# Patient Record
Sex: Female | Born: 1975 | Race: White | Hispanic: No | Marital: Married | Smoking: Never smoker
Health system: Southern US, Community
[De-identification: ages and names within clinical notes are randomized; demographics above are authoritative.]

## PROBLEM LIST (undated history)

## (undated) DIAGNOSIS — E079 Disorder of thyroid, unspecified: Secondary | ICD-10-CM

## (undated) DIAGNOSIS — G4730 Sleep apnea, unspecified: Secondary | ICD-10-CM

## (undated) HISTORY — PX: WISDOM TOOTH EXTRACTION: SHX21

## (undated) HISTORY — DX: Sleep apnea, unspecified: G47.30

## (undated) HISTORY — PX: CHOLECYSTECTOMY: SHX55

## (undated) HISTORY — PX: TUBAL LIGATION: SHX77

## (undated) HISTORY — PX: BACK SURGERY: SHX140

---

## 2000-05-16 ENCOUNTER — Other Ambulatory Visit: Admission: RE | Admit: 2000-05-16 | Discharge: 2000-05-16 | Payer: Self-pay | Admitting: Obstetrics and Gynecology

## 2000-10-27 ENCOUNTER — Inpatient Hospital Stay (HOSPITAL_COMMUNITY): Admission: AD | Admit: 2000-10-27 | Discharge: 2000-10-29 | Payer: Self-pay | Admitting: Obstetrics and Gynecology

## 2001-06-14 ENCOUNTER — Other Ambulatory Visit: Admission: RE | Admit: 2001-06-14 | Discharge: 2001-06-14 | Payer: Self-pay | Admitting: Obstetrics and Gynecology

## 2004-04-05 ENCOUNTER — Other Ambulatory Visit: Admission: RE | Admit: 2004-04-05 | Discharge: 2004-04-05 | Payer: Self-pay | Admitting: Obstetrics and Gynecology

## 2004-05-17 ENCOUNTER — Ambulatory Visit (HOSPITAL_COMMUNITY): Admission: RE | Admit: 2004-05-17 | Discharge: 2004-05-17 | Payer: Self-pay | Admitting: Obstetrics and Gynecology

## 2004-09-28 ENCOUNTER — Ambulatory Visit (HOSPITAL_COMMUNITY): Admission: RE | Admit: 2004-09-28 | Discharge: 2004-09-28 | Payer: Self-pay | Admitting: Obstetrics and Gynecology

## 2004-10-05 ENCOUNTER — Encounter (INDEPENDENT_AMBULATORY_CARE_PROVIDER_SITE_OTHER): Payer: Self-pay | Admitting: Specialist

## 2004-10-05 ENCOUNTER — Inpatient Hospital Stay (HOSPITAL_COMMUNITY): Admission: RE | Admit: 2004-10-05 | Discharge: 2004-10-08 | Payer: Self-pay | Admitting: Obstetrics and Gynecology

## 2006-01-10 ENCOUNTER — Other Ambulatory Visit: Admission: RE | Admit: 2006-01-10 | Discharge: 2006-01-10 | Payer: Self-pay | Admitting: Obstetrics and Gynecology

## 2007-01-12 ENCOUNTER — Emergency Department (HOSPITAL_COMMUNITY): Admission: EM | Admit: 2007-01-12 | Discharge: 2007-01-13 | Payer: Self-pay | Admitting: Emergency Medicine

## 2007-04-03 ENCOUNTER — Ambulatory Visit (HOSPITAL_COMMUNITY): Admission: RE | Admit: 2007-04-03 | Discharge: 2007-04-03 | Payer: Self-pay | Admitting: Internal Medicine

## 2007-06-07 ENCOUNTER — Ambulatory Visit: Payer: Self-pay | Admitting: Gastroenterology

## 2007-06-07 LAB — CONVERTED CEMR LAB
ALT: 25 units/L (ref 0–35)
AST: 21 units/L (ref 0–37)
Albumin: 4.1 g/dL (ref 3.5–5.2)
Alkaline Phosphatase: 33 units/L — ABNORMAL LOW (ref 39–117)
BUN: 8 mg/dL (ref 6–23)
Basophils Absolute: 0 10*3/uL (ref 0.0–0.1)
Basophils Relative: 0.5 % (ref 0.0–1.0)
CO2: 25 meq/L (ref 19–32)
Calcium: 9.1 mg/dL (ref 8.4–10.5)
Chloride: 104 meq/L (ref 96–112)
Creatinine, Ser: 0.7 mg/dL (ref 0.4–1.2)
Eosinophils Absolute: 0.1 10*3/uL (ref 0.0–0.6)
Eosinophils Relative: 1.2 % (ref 0.0–5.0)
GFR calc Af Amer: 126 mL/min
GFR calc non Af Amer: 104 mL/min
Glucose, Bld: 99 mg/dL (ref 70–99)
HCT: 42.4 % (ref 36.0–46.0)
Hemoglobin: 15.1 g/dL — ABNORMAL HIGH (ref 12.0–15.0)
Lipase: 30 units/L (ref 11.0–59.0)
Lymphocytes Relative: 30.4 % (ref 12.0–46.0)
MCHC: 35.6 g/dL (ref 30.0–36.0)
MCV: 89.5 fL (ref 78.0–100.0)
Monocytes Absolute: 0.5 10*3/uL (ref 0.2–0.7)
Monocytes Relative: 9.5 % (ref 3.0–11.0)
Neutro Abs: 3 10*3/uL (ref 1.4–7.7)
Neutrophils Relative %: 58.4 % (ref 43.0–77.0)
Platelets: 191 10*3/uL (ref 150–400)
Potassium: 3.7 meq/L (ref 3.5–5.1)
RBC: 4.74 M/uL (ref 3.87–5.11)
RDW: 11.5 % (ref 11.5–14.6)
Sodium: 136 meq/L (ref 135–145)
Total Bilirubin: 1 mg/dL (ref 0.3–1.2)
Total Protein: 7.6 g/dL (ref 6.0–8.3)
WBC: 5.2 10*3/uL (ref 4.5–10.5)

## 2007-12-24 DIAGNOSIS — K8020 Calculus of gallbladder without cholecystitis without obstruction: Secondary | ICD-10-CM | POA: Insufficient documentation

## 2011-02-14 NOTE — Assessment & Plan Note (Signed)
[facility]                         GASTROENTEROLOGY OFFICE NOTE   [name], [name]             MRN:          [id number]  DATE:06/07/2007                            DOB:          10-14-1975    REASON FOR CONSULTATION:  Episodic upper abdominal pain and  cholelithiasis.   HISTORY OF PRESENT ILLNESS:  [name] is a 35 year old white  female referred through the courtesy of Dr. Jarome Matin.  She  relates a 76-month history of episodic epigastric and upper abdominal  pain that radiates to her back and is associated with nausea.  Her  symptoms generally occur after 6 in the evening.  Her symptoms will last  for several hours and then they will abate, and she will feel well for  several weeks at a time.  She was seen in the emergency room on July 2,  and an abdominal ultrasound showed cholelithiasis without gallbladder  wall thickening or pericholecystic fluid.  The extrahepatic bile duct  measured at 3 mm, and the remainder of the ultrasound examination was  unremarkable.  Chest x-ray performed on April 01, 2007 was normal.  She  has a family history of colon cancer in a maternal grandfather and a  maternal aunt.  Both developed colon cancer in their 20s.  No other  family members with colon cancer, colon polyps, or inflammatory bowel  disease.  She denies any heartburn, reflux symptoms, weight loss, gas,  bloating, change in bowel habits, diarrhea, constipation, melena or  hematochezia.   PAST MEDICAL HISTORY:  1. Status post bilateral tubal ligation.  2. Cholelithiasis.   CURRENT MEDICATIONS:  None.   MEDICATION ALLERGIES:  None known.   SOCIAL HISTORY:  She is married with 3 children, and she is employed  with [employer] [place].  She drinks a very modest amount of alcohol  socially, once or twice a month.  She denies tobacco product usage.   REVIEW OF SYSTEMS:  Several areas positive as per the handwritten form.   PHYSICAL EXAMINATION:  Overweight white female, no acute distress.  Height 5 feet 6 inches, weight 246 pounds.  Blood pressure is 118/76,  pulse 72 and regular.  HEENT EXAM:  Anicteric sclerae, oropharynx clear.  CHEST:  Clear to auscultation bilaterally.  CARDIAC:  Regular rate and rhythm without murmurs appreciated.  ABDOMEN:  Soft, nontender, nondistended.  Normoactive bowel sounds.  No  palpable organomegaly, masses, or hernia.  EXTREMITIES:  Without clubbing, cyanosis, or edema.  NEUROLOGIC:  Alert and oriented x3.  Grossly nonfocal.   ASSESSMENT AND PLAN:  1. Presumed symptomatic cholelithiasis.  Her intermittent symptoms are      typical for biliary      colic.  Will obtain a CBC, CMET, and lipase today.  Proceed with      surgical consultation.  2. Family history of colon cancer. Consider colonoscopy beginning at      age 67.     Venita Lick. Russella Dar, MD, [facility]  Electronically Signed    MTS/MedQ  DD: 06/07/2007  DT: 06/07/2007  Job #: [id number]   cc:   Barry Dienes. Eloise Harman, M.D.

## 2011-02-17 NOTE — Op Note (Signed)
[name], [name]      ACCOUNT NO.:  [id number]   MEDICAL RECORD NO.:  [id number]          PATIENT TYPE:  INP   LOCATION:  9147                          FACILITY:  [facility]   PHYSICIAN:  Randye Lobo, M.D.   DATE OF BIRTH:  1976-09-01   DATE OF PROCEDURE:  10/05/2004  DATE OF DISCHARGE:                                 OPERATIVE REPORT   PREOPERATIVE DIAGNOSIS:  1.  Intrauterine gestation at 38-5/7 weeks.  2.  Suspected fetal macrosomia.  3.  Multiparous female, desire for permanent sterilization.   POSTOPERATIVE DIAGNOSIS:  1.  Intrauterine gestation at 38-5/7 weeks.  2.  Fetal macrosomia.  3.  Multiparous female, desire for permanent sterilization.   PROCEDURE:  Primary low transverse cesarean section.   SURGEON:  Randye Lobo, M.D.   ASSISTANT:  Ilda Mori, M.D.   ANESTHESIA:  Spinal.   FLUIDS REPLACED:  4000 mL Ringer's lactate.   ESTIMATED BLOOD LOSS:  1000 mL.   URINE OUTPUT:  125 mL.   COMPLICATIONS:  None.   INDICATIONS FOR PROCEDURE:  The patient is a 35 year old gravida 5, para 2-0-  2-2, Caucasian female at 38-5/[redacted] weeks gestation, who presented in the office  measuring size greater than dates.  The patient underwent an ultrasound to  determine estimated fetal weight which is approximately 4350 grams on the  day of surgery.  The patient did not have gestational diabetes.  A  discussion was held with the patient regarding her diagnosis of suspected  fetal macrosomia and a plan was made to proceed with a primary cesarean  section.  The patient also expressed the desire for permanent sterilization  and a plan was made to proceed with a bilateral tubal ligation at the time  of her surgery.  Risks, benefits, and alternatives to the cesarean delivery  and to the tubal ligation were discussed with the patient who chose to  proceed.  The patient was quoted a failure rate of approximately 1 in 250 to  1 in 300 which may result in either an intrauterine  or ectopic pregnancy.   FINDINGS:  A viable female was delivered at 12:44 p.m. with Apgars of 9 at one  minute and 9 at five minutes.  The weight was noted to be 9 pounds 12  ounces.  The newborn was vigorous at birth.  The uterus, tubes, and ovaries  were noted to be normal.  The placenta had a normal insertion of a three-  vessel cord.   DESCRIPTION OF PROCEDURE:  The patient was reidentified in the preoperative  holding area.  She was taken down to the operating room where a spinal  anesthetic was administered.  With the patient in the supine position with a  left lateral tilt, the abdomen was then sterilely prepped and a Foley  catheter placed inside the bladder.  She was then sterilely draped.   A Pfannenstiel incision was created sharply with the scalpel.  This was  carried down to the fascia using a scalpel and monopolar cautery.  The  fascia was then incised in the midline and the incision was extended  bilaterally with the Mayo scissors.  The rectus muscles were dissected from  the overlying fascia superiorly and inferiorly and the rectus muscles were  then sharply divided.   The parietoperitoneum was entered with a Hemostat clamp.  The incision was  extended cranially and caudally.   The lower uterine segment was exposed with the bladder retractor.  The  bladder flap was then created sharply.  A transverse lower uterine segment  incision was created with a scalpel and the incision was extended  bilaterally bluntly.  Membranes were ruptured bluntly as well.  A hand was  inserted through the incision and the vertex was delivered.  The nares and  mouth were suctioned and the remainder of the newborn infant was delivered  without difficulty.  The cord was doubly clamped and cut and the newborn was  carried over to the awaiting pediatricians.   The patient did receive Ancef 1 gram intravenously at cord clamp.  The  placenta was manually extracted and she then received Pitocin 20  units IV.  The uterine incision was closed with a single layer closure of #1 chromic  which was performed in a running locking fashion.  Hemostasis was excellent.   The left fallopian tube was identified and followed all the way to its  fimbriated end.  It was grasped with a Babcock and a free tie of 0 plain gut  suture was placed in the isthmic portion of the fallopian tube to create a  knuckle of tissue.  A second free tie of 0 plain gut suture was placed.  The  Metzenbaum scissors were then used to sharply excise a portion of the left  fallopian tube.  The same procedure that was performed on the left fallopian  tube was performed on the right tube after it was grasped and followed all  the way to its fimbriated end.  Portions of each of the tubes were sent to  pathology.   At this time, the uterus was noted to be hemostatic.  The abdomen was  irrigated and suctioned.  With hemostasis being excellent, the abdomen was  therefore closed.  The peritoneum was closed with a running suture of 3-0  Vicryl.  The rectus muscles were brought together in the midline with  interrupted sutures of #1 chromic.  The fascia was closed with a running  suture of 0 Vicryl.  The subcutaneous tissue was irrigated and suctioned and  made hemostatic with monopolar cautery.  The skin was closed with staples.  A sterile bandage was placed over the incision.   This concluded the patient's procedure. There were no complications.  All  needle, sponge, and instrument counts correct.  The patient was excorted to  the recovery room in stable and awake condition.      BES/MEDQ  D:  10/05/2004  T:  10/05/2004  Job:  [id number]

## 2011-02-17 NOTE — Discharge Summary (Signed)
[name], [name]      ACCOUNT NO.:  [id number]   MEDICAL RECORD NO.:  [id number]          PATIENT TYPE:  INP   LOCATION:  9147                          FACILITY:  [facility]   PHYSICIAN:  Miguel Aschoff, M.D.       DATE OF BIRTH:  1975-10-06   DATE OF ADMISSION:  10/05/2004  DATE OF DISCHARGE:  10/08/2004                                 DISCHARGE SUMMARY   FINAL DIAGNOSIS:  Intrauterine pregnancy at 38-5/[redacted] weeks gestation.  Suspected fetal macrostomia.  Multiparous female desiring permanent  sterilization.  Immune rubella status.   PROCEDURE:  Primary low transverse cesarean section and bilateral tubal  ligation.   SURGEON:  Randye Lobo, M.D.  Assisted by Dr. Ilda Mori.   COMPLICATIONS:  None.   This 35 year old G5 P2-0-2-2 presents at term for cesarean section.  She  presented in the office measuring size greater than dates and underwent an  ultrasound which estimated a fetal weight of 4350 grams on the day of the  surgery.  The patient does not have gestational diabetes and her antepartum  course up this point was uncomplicated.  She did have a nonimmune rubella  status.  A discussion was made with the patient and the decision was made to  proceed with a cesarean section.  The patient also expresses her desire for  permanent sterilization.  The patient was taken to the operating room on  10/05/04 by Dr. Karma Greaser where a primary low transverse cesarean section was  performed with the delivery of a 9 pound 12 ounce female infant with Apgars of  9 and 9.  Delivery went without complications.  At this point, a permanent  sterilization procedure was performed without complications.  The patient's  postoperative course was benign without complications.  Their little boy was  circumcised before discharge.  The patient was felt ready for discharge on  postoperative day #3.  She did receive her rubella vaccine prior to  discharge.  She was sent home on a regular diet, told to  decrease her  activities, told to continue prenatal vitamins, was given Tylox 1-2 every 4  hours as needed for pain, told to follow up in the office in four weeks.   LABS ON DISCHARGE:  She had a hemoglobin of 11.3, white blood cell count of  6.9, and platelets of 136,000.      MB/MEDQ  D:  10/27/2004  T:  10/27/2004  Job:  [id number]

## 2012-06-20 ENCOUNTER — Emergency Department (HOSPITAL_BASED_OUTPATIENT_CLINIC_OR_DEPARTMENT_OTHER)
Admission: EM | Admit: 2012-06-20 | Discharge: 2012-06-20 | Disposition: A | Discharge status: Home / Self Care | Payer: Managed Care, Other (non HMO) | Attending: Emergency Medicine | Admitting: Emergency Medicine

## 2012-06-20 ENCOUNTER — Encounter (HOSPITAL_BASED_OUTPATIENT_CLINIC_OR_DEPARTMENT_OTHER): Payer: Self-pay | Admitting: *Deleted

## 2012-06-20 ENCOUNTER — Emergency Department (HOSPITAL_BASED_OUTPATIENT_CLINIC_OR_DEPARTMENT_OTHER): Payer: Managed Care, Other (non HMO)

## 2012-06-20 DIAGNOSIS — R079 Chest pain, unspecified: Secondary | ICD-10-CM | POA: Insufficient documentation

## 2012-06-20 DIAGNOSIS — R0602 Shortness of breath: Secondary | ICD-10-CM | POA: Insufficient documentation

## 2012-06-20 DIAGNOSIS — R1013 Epigastric pain: Secondary | ICD-10-CM

## 2012-06-20 LAB — CBC WITH DIFFERENTIAL/PLATELET
Basophils Absolute: 0.1 10*3/uL (ref 0.0–0.1)
Basophils Relative: 1 % (ref 0–1)
Eosinophils Absolute: 0.1 10*3/uL (ref 0.0–0.7)
HCT: 41.5 % (ref 36.0–46.0)
Lymphocytes Relative: 34 % (ref 12–46)
Lymphs Abs: 1.8 10*3/uL (ref 0.7–4.0)
MCH: 31.3 pg (ref 26.0–34.0)
MCHC: 36.1 g/dL — ABNORMAL HIGH (ref 30.0–36.0)
MCV: 86.6 fL (ref 78.0–100.0)
Monocytes Absolute: 0.6 10*3/uL (ref 0.1–1.0)
Monocytes Relative: 10 % (ref 3–12)
Neutro Abs: 2.9 10*3/uL (ref 1.7–7.7)
Neutrophils Relative %: 54 % (ref 43–77)
Platelets: 131 10*3/uL — ABNORMAL LOW (ref 150–400)
RBC: 4.79 MIL/uL (ref 3.87–5.11)
RDW: 11.8 % (ref 11.5–15.5)
WBC: 5.4 10*3/uL (ref 4.0–10.5)

## 2012-06-20 LAB — PREGNANCY, URINE: Preg Test, Ur: NEGATIVE

## 2012-06-20 LAB — COMPREHENSIVE METABOLIC PANEL
ALT: 41 U/L — ABNORMAL HIGH (ref 0–35)
Albumin: 4.2 g/dL (ref 3.5–5.2)
Alkaline Phosphatase: 40 U/L (ref 39–117)
BUN: 10 mg/dL (ref 6–23)
CO2: 23 mEq/L (ref 19–32)
Calcium: 9.4 mg/dL (ref 8.4–10.5)
Chloride: 100 mEq/L (ref 96–112)
Creatinine, Ser: 0.6 mg/dL (ref 0.50–1.10)
GFR calc Af Amer: >90 mL/min (ref >90)
GFR calc non Af Amer: >90 mL/min (ref >90)
Glucose, Bld: 99 mg/dL (ref 70–99)
Potassium: 3.6 mEq/L (ref 3.5–5.1)
Sodium: 136 mEq/L (ref 135–145)
Total Protein: 7.8 g/dL (ref 6.0–8.3)

## 2012-06-20 LAB — URINE MICROSCOPIC-ADD ON

## 2012-06-20 LAB — URINALYSIS, ROUTINE W REFLEX MICROSCOPIC
Bilirubin Urine: NEGATIVE
Ketones, ur: NEGATIVE mg/dL
Leukocytes, UA: NEGATIVE
Nitrite: NEGATIVE
Protein, ur: NEGATIVE mg/dL
Specific Gravity, Urine: 1.003 — ABNORMAL LOW (ref 1.005–1.030)
Urobilinogen, UA: 0.2 mg/dL (ref 0.0–1.0)
pH: 6 (ref 5.0–8.0)

## 2012-06-20 LAB — LIPASE, BLOOD: Lipase: 45 U/L (ref 11–59)

## 2012-06-20 MED ORDER — ONDANSETRON 8 MG PO TBDP: 8.0000 mg | ORAL_TABLET | Freq: Three times a day (TID) | ORAL | Status: DC | PRN

## 2012-06-20 MED ORDER — ONDANSETRON HCL 4 MG/2ML IJ SOLN
INTRAMUSCULAR | Status: AC
  Filled 2012-06-20: qty 2

## 2012-06-20 MED ORDER — SODIUM CHLORIDE 0.9 % IV BOLUS (SEPSIS)
1000.0000 mL | Freq: Once | INTRAVENOUS | Status: AC
  Administered 2012-06-20: 1000 mL via INTRAVENOUS

## 2012-06-20 MED ORDER — PANTOPRAZOLE SODIUM 20 MG PO TBEC: 40.0000 mg | DELAYED_RELEASE_TABLET | Freq: Every day | ORAL | Status: DC

## 2012-06-20 MED ORDER — HYDROMORPHONE HCL PF 1 MG/ML IJ SOLN
INTRAMUSCULAR | Status: AC
  Filled 2012-06-20: qty 1

## 2012-06-20 MED ORDER — ONDANSETRON HCL 4 MG/2ML IJ SOLN
4.0000 mg | Freq: Once | INTRAMUSCULAR | Status: AC
  Administered 2012-06-20: 4 mg via INTRAVENOUS

## 2012-06-20 MED ORDER — HYDROMORPHONE HCL PF 1 MG/ML IJ SOLN
1.0000 mg | Freq: Once | INTRAMUSCULAR | Status: AC
  Administered 2012-06-20: 1 mg via INTRAVENOUS

## 2012-06-20 NOTE — ED Notes (Signed)
Mid chest pain /epigastric pain onset this morning while at work at [employer] [place] states is under stress no other symptoms 12 lead ekg unremarkable

## 2012-06-20 NOTE — ED Provider Notes (Addendum)
History     CSN: [id number]  Arrival date & time 06/20/12  1309   First MD Initiated Contact with Patient 06/20/12 1306      Chief Complaint  Patient presents with  . Abdominal Pain    (Consider location/radiation/quality/duration/timing/severity/associated sxs/prior treatment) HPI  Patient had episode of epigastric pain which radiated through to her back. She was somewhat nauseated with this but did not vomit. Episode lasted for 10 minutes with severe pain and has now decreased to almost 0. This began approximately 40 minutes prior to evaluation. The pain is somewhat crampy in nature. He has had her gallbladder removed. She ate a granola bar prior to this. She has not had any fever or chills. She has no history of cardiac problems and is not dyspneic. Her bowel movements have been normal.  History reviewed. No pertinent past medical history.  Past Surgical History  Procedure Date  . Cholecystectomy     History reviewed. No pertinent family history.  History  Substance Use Topics  . Smoking status: Not on file  . Smokeless tobacco: Not on file  . Alcohol Use:     OB History    Grav Para Term Preterm Abortions TAB SAB Ect Mult Living                  Review of Systems  Constitutional: Negative for fever, chills, activity change, appetite change and unexpected weight change.  HENT: Negative for sore throat, rhinorrhea, neck pain, neck stiffness and sinus pressure.   Eyes: Negative for visual disturbance.  Respiratory: Negative for cough and shortness of breath.   Cardiovascular: Negative for chest pain and leg swelling.  Gastrointestinal: Negative for vomiting, abdominal pain, diarrhea and blood in stool.  Genitourinary: Negative for dysuria, urgency, frequency, vaginal discharge and difficulty urinating.  Musculoskeletal: Negative for myalgias, arthralgias and gait problem.  Skin: Negative for color change and rash.  Neurological: Negative for weakness,  light-headedness and headaches.  Hematological: Does not bruise/bleed easily.  Psychiatric/Behavioral: Negative for dysphoric mood.    Allergies  Review of patient's allergies indicates not on file.  Home Medications  No current outpatient prescriptions on file.  BP 134/79  Pulse 102  Temp 98.2 F (36.8 C) (Oral)  Resp 18  Ht 5\' 5"  (1.651 m)  Wt 265 lb (120.203 kg)  BMI 44.10 kg/m2  SpO2 96%  LMP 05/30/2012  Physical Exam  Nursing note and vitals reviewed. Constitutional: She is oriented to person, place, and time. She appears well-developed and well-nourished.  HENT:  Head: Normocephalic and atraumatic.  Eyes: Conjunctivae normal and EOM are normal. Pupils are equal, round, and reactive to light.  Neck: Normal range of motion. Neck supple.  Cardiovascular: Normal rate, regular rhythm, normal heart sounds and intact distal pulses.   Pulmonary/Chest: Effort normal and breath sounds normal.  Abdominal: Soft. Bowel sounds are normal. She exhibits no distension and no mass. There is tenderness. There is no rebound and no guarding.       Moderate epigastric tenderness with palpation  Musculoskeletal: Normal range of motion.  Neurological: She is alert and oriented to person, place, and time.  Skin: Skin is warm and dry.  Psychiatric: She has a normal mood and affect. Thought content normal.    ED Course  Procedures (including critical care time)  Labs Reviewed  CBC WITH DIFFERENTIAL - Abnormal; Notable for the following:    MCHC 36.1 (*)     Platelets 131 (*)     All other components  within normal limits  COMPREHENSIVE METABOLIC PANEL - Abnormal; Notable for the following:    ALT 41 (*)     All other components within normal limits  URINALYSIS, ROUTINE W REFLEX MICROSCOPIC - Abnormal; Notable for the following:    Specific Gravity, Urine 1.003 (*)     Hgb urine dipstick SMALL (*)     All other components within normal limits  LIPASE, BLOOD  PREGNANCY, URINE  URINE  MICROSCOPIC-ADD ON   Dg Chest 2 View  06/20/2012  *RADIOLOGY REPORT*  Clinical Data: Cough, chest pain, shortness of breath  CHEST - 2 VIEW  Comparison: None.  Findings: On the frontal view there is a rounded nodular opacity overlying the anterior left fourth rib.  This could conceivably represent callus secondary to a healing left anterior fourth rib fracture, but a lung nodule cannot be excluded.  Left rib detail films may be helpful for further assessment.  No infiltrate or effusion is seen.  The heart is within normal limits in size.  No other bony abnormality is seen.  IMPRESSION:   Nodular opacity overlies the anterior left fourth rib.  Possible callus around healing rib fracture versus lung nodule.  Consider left rib detail films.   Original Report Authenticated By: Juline Patch, M.D.      No diagnosis found.    MDM   Results for orders placed during the hospital encounter of 06/20/12  CBC WITH DIFFERENTIAL      Component Value Range   WBC 5.4  4.0 - 10.5 K/uL   RBC 4.79  3.87 - 5.11 MIL/uL   Hemoglobin 15.0  12.0 - 15.0 g/dL   HCT 16.1  09.6 - 04.5 %   MCV 86.6  78.0 - 100.0 fL   MCH 31.3  26.0 - 34.0 pg   MCHC 36.1 (*) 30.0 - 36.0 g/dL   RDW 40.9  81.1 - 91.4 %   Platelets 131 (*) 150 - 400 K/uL   Neutrophils Relative 54  43 - 77 %   Neutro Abs 2.9  1.7 - 7.7 K/uL   Lymphocytes Relative 34  12 - 46 %   Lymphs Abs 1.8  0.7 - 4.0 K/uL   Monocytes Relative 10  3 - 12 %   Monocytes Absolute 0.6  0.1 - 1.0 K/uL   Eosinophils Relative 1  0 - 5 %   Eosinophils Absolute 0.1  0.0 - 0.7 K/uL   Basophils Relative 1  0 - 1 %   Basophils Absolute 0.1  0.0 - 0.1 K/uL  COMPREHENSIVE METABOLIC PANEL      Component Value Range   Sodium 136  135 - 145 mEq/L   Potassium 3.6  3.5 - 5.1 mEq/L   Chloride 100  96 - 112 mEq/L   CO2 23  19 - 32 mEq/L   Glucose, Bld 99  70 - 99 mg/dL   BUN 10  6 - 23 mg/dL   Creatinine, Ser 7.82  0.50 - 1.10 mg/dL   Calcium 9.4  8.4 - 95.6 mg/dL   Total  Protein 7.8  6.0 - 8.3 g/dL   Albumin 4.2  3.5 - 5.2 g/dL   AST 32  0 - 37 U/L   ALT 41 (*) 0 - 35 U/L   Alkaline Phosphatase 40  39 - 117 U/L   Total Bilirubin 0.7  0.3 - 1.2 mg/dL   GFR calc non Af Amer >90  >90 mL/min   GFR calc Af Amer >90  >90  mL/min  LIPASE, BLOOD      Component Value Range   Lipase 45  11 - 59 U/L  URINALYSIS, ROUTINE W REFLEX MICROSCOPIC      Component Value Range   Color, Urine YELLOW  YELLOW   APPearance CLEAR  CLEAR   Specific Gravity, Urine 1.003 (*) 1.005 - 1.030   pH 6.0  5.0 - 8.0   Glucose, UA NEGATIVE  NEGATIVE mg/dL   Hgb urine dipstick SMALL (*) NEGATIVE   Bilirubin Urine NEGATIVE  NEGATIVE   Ketones, ur NEGATIVE  NEGATIVE mg/dL   Protein, ur NEGATIVE  NEGATIVE mg/dL   Urobilinogen, UA 0.2  0.0 - 1.0 mg/dL   Nitrite NEGATIVE  NEGATIVE   Leukocytes, UA NEGATIVE  NEGATIVE  PREGNANCY, URINE      Component Value Range   Preg Test, Ur NEGATIVE  NEGATIVE  URINE MICROSCOPIC-ADD ON      Component Value Range   Squamous Epithelial / LPF RARE  RARE   WBC, UA 0-2  <3 WBC/hpf   RBC / HPF 0-2  <3 RBC/hpf   Bacteria, UA RARE  RARE     Patient improved here after IV pain medicine, antiemetics and IV fluids. Pain had increased to a 3/10 after palpation of her abdomen but has again decreased. She has not had any vomiting here. Heart rate has decreased to the 80s and blood pressure has remained stable. There are no acute abnormalities noted on her labs. Test x-Shanyia Stines shows possible callus of her fourth rib I discussed this with the patient and advised followup. She is advised to have a low-fat diet. She'll be discharged on Protonix and Zofran. She is instructed to return if she is worse at any time otherwise she will followup with her primary care physician.        Hilario Quarry, MD 06/20/12 1523  Hilario Quarry, MD 06/25/12 2008

## 2012-06-21 ENCOUNTER — Ambulatory Visit (HOSPITAL_COMMUNITY)
Admission: RE | Admit: 2012-06-21 | Discharge: 2012-06-21 | Disposition: A | Discharge status: Home / Self Care | Payer: Managed Care, Other (non HMO) | Source: Ambulatory Visit | Attending: Internal Medicine | Admitting: Internal Medicine

## 2012-06-21 ENCOUNTER — Other Ambulatory Visit (HOSPITAL_COMMUNITY): Payer: Self-pay | Admitting: Internal Medicine

## 2012-06-21 DIAGNOSIS — J984 Other disorders of lung: Secondary | ICD-10-CM | POA: Insufficient documentation

## 2012-06-21 DIAGNOSIS — M899 Disorder of bone, unspecified: Secondary | ICD-10-CM | POA: Insufficient documentation

## 2012-06-21 DIAGNOSIS — R9389 Abnormal findings on diagnostic imaging of other specified body structures: Secondary | ICD-10-CM

## 2016-05-17 ENCOUNTER — Other Ambulatory Visit: Payer: Self-pay | Admitting: Obstetrics and Gynecology

## 2016-05-17 DIAGNOSIS — Z6841 Body Mass Index (BMI) 40.0 and over, adult: Secondary | ICD-10-CM

## 2016-05-17 DIAGNOSIS — Z124 Encounter for screening for malignant neoplasm of cervix: Secondary | ICD-10-CM | POA: Diagnosis not present

## 2016-05-17 DIAGNOSIS — Z1231 Encounter for screening mammogram for malignant neoplasm of breast: Secondary | ICD-10-CM | POA: Diagnosis not present

## 2016-05-17 DIAGNOSIS — Z01419 Encounter for gynecological examination (general) (routine) without abnormal findings: Secondary | ICD-10-CM | POA: Diagnosis not present

## 2016-05-22 LAB — CYTOLOGY - PAP

## 2016-06-02 DIAGNOSIS — L918 Other hypertrophic disorders of the skin: Secondary | ICD-10-CM | POA: Diagnosis not present

## 2016-06-02 DIAGNOSIS — L821 Other seborrheic keratosis: Secondary | ICD-10-CM | POA: Diagnosis not present

## 2016-06-02 DIAGNOSIS — B078 Other viral warts: Secondary | ICD-10-CM | POA: Diagnosis not present

## 2016-06-23 DIAGNOSIS — J328 Other chronic sinusitis: Secondary | ICD-10-CM | POA: Diagnosis not present

## 2016-06-23 DIAGNOSIS — Z1389 Encounter for screening for other disorder: Secondary | ICD-10-CM | POA: Diagnosis not present

## 2016-06-23 DIAGNOSIS — F4321 Adjustment disorder with depressed mood: Secondary | ICD-10-CM | POA: Diagnosis not present

## 2016-06-23 DIAGNOSIS — E784 Other hyperlipidemia: Secondary | ICD-10-CM | POA: Diagnosis not present

## 2016-08-14 DIAGNOSIS — Z23 Encounter for immunization: Secondary | ICD-10-CM | POA: Diagnosis not present

## 2016-08-14 DIAGNOSIS — Z6841 Body Mass Index (BMI) 40.0 and over, adult: Secondary | ICD-10-CM | POA: Diagnosis not present

## 2016-08-14 DIAGNOSIS — F4321 Adjustment disorder with depressed mood: Secondary | ICD-10-CM | POA: Diagnosis not present

## 2016-11-03 DIAGNOSIS — R05 Cough: Secondary | ICD-10-CM | POA: Diagnosis not present

## 2016-11-03 DIAGNOSIS — J310 Chronic rhinitis: Secondary | ICD-10-CM | POA: Diagnosis not present

## 2016-11-03 DIAGNOSIS — Z6841 Body Mass Index (BMI) 40.0 and over, adult: Secondary | ICD-10-CM | POA: Diagnosis not present

## 2016-11-03 DIAGNOSIS — H6690 Otitis media, unspecified, unspecified ear: Secondary | ICD-10-CM | POA: Diagnosis not present

## 2017-02-05 DIAGNOSIS — E784 Other hyperlipidemia: Secondary | ICD-10-CM | POA: Diagnosis not present

## 2017-02-12 DIAGNOSIS — E784 Other hyperlipidemia: Secondary | ICD-10-CM | POA: Diagnosis not present

## 2017-02-12 DIAGNOSIS — Z1389 Encounter for screening for other disorder: Secondary | ICD-10-CM | POA: Diagnosis not present

## 2017-02-12 DIAGNOSIS — F4321 Adjustment disorder with depressed mood: Secondary | ICD-10-CM | POA: Diagnosis not present

## 2017-02-12 DIAGNOSIS — Z0000 Encounter for general adult medical examination without abnormal findings: Secondary | ICD-10-CM | POA: Diagnosis not present

## 2017-02-12 DIAGNOSIS — J310 Chronic rhinitis: Secondary | ICD-10-CM | POA: Diagnosis not present

## 2017-07-12 DIAGNOSIS — Z6841 Body Mass Index (BMI) 40.0 and over, adult: Secondary | ICD-10-CM | POA: Diagnosis not present

## 2017-07-12 DIAGNOSIS — R197 Diarrhea, unspecified: Secondary | ICD-10-CM | POA: Diagnosis not present

## 2018-02-13 DIAGNOSIS — Z0000 Encounter for general adult medical examination without abnormal findings: Secondary | ICD-10-CM | POA: Diagnosis not present

## 2018-02-18 DIAGNOSIS — R21 Rash and other nonspecific skin eruption: Secondary | ICD-10-CM | POA: Diagnosis not present

## 2018-02-18 DIAGNOSIS — E7849 Other hyperlipidemia: Secondary | ICD-10-CM | POA: Diagnosis not present

## 2018-02-18 DIAGNOSIS — Z1389 Encounter for screening for other disorder: Secondary | ICD-10-CM | POA: Diagnosis not present

## 2018-02-18 DIAGNOSIS — Z0000 Encounter for general adult medical examination without abnormal findings: Secondary | ICD-10-CM | POA: Diagnosis not present

## 2018-02-18 DIAGNOSIS — D696 Thrombocytopenia, unspecified: Secondary | ICD-10-CM | POA: Diagnosis not present

## 2018-07-05 DIAGNOSIS — Z6839 Body mass index (BMI) 39.0-39.9, adult: Secondary | ICD-10-CM | POA: Diagnosis not present

## 2018-07-05 DIAGNOSIS — R202 Paresthesia of skin: Secondary | ICD-10-CM | POA: Diagnosis not present

## 2018-07-05 DIAGNOSIS — M545 Low back pain: Secondary | ICD-10-CM | POA: Diagnosis not present

## 2018-07-05 DIAGNOSIS — M5416 Radiculopathy, lumbar region: Secondary | ICD-10-CM | POA: Diagnosis not present

## 2018-07-22 DIAGNOSIS — M419 Scoliosis, unspecified: Secondary | ICD-10-CM | POA: Diagnosis not present

## 2018-07-22 DIAGNOSIS — M47817 Spondylosis without myelopathy or radiculopathy, lumbosacral region: Secondary | ICD-10-CM | POA: Diagnosis not present

## 2018-07-22 DIAGNOSIS — M5126 Other intervertebral disc displacement, lumbar region: Secondary | ICD-10-CM | POA: Diagnosis not present

## 2018-07-22 DIAGNOSIS — M47816 Spondylosis without myelopathy or radiculopathy, lumbar region: Secondary | ICD-10-CM | POA: Diagnosis not present

## 2018-08-19 ENCOUNTER — Encounter (INDEPENDENT_AMBULATORY_CARE_PROVIDER_SITE_OTHER): Payer: Self-pay | Admitting: Physical Medicine and Rehabilitation

## 2018-08-19 ENCOUNTER — Ambulatory Visit (INDEPENDENT_AMBULATORY_CARE_PROVIDER_SITE_OTHER): Payer: Self-pay

## 2018-08-19 ENCOUNTER — Ambulatory Visit (INDEPENDENT_AMBULATORY_CARE_PROVIDER_SITE_OTHER): Payer: BLUE CROSS/BLUE SHIELD | Admitting: Physical Medicine and Rehabilitation

## 2018-08-19 VITALS — BP 122/65 | HR 60 | Temp 97.4°F

## 2018-08-19 DIAGNOSIS — G8929 Other chronic pain: Secondary | ICD-10-CM | POA: Diagnosis not present

## 2018-08-19 DIAGNOSIS — M5442 Lumbago with sciatica, left side: Secondary | ICD-10-CM

## 2018-08-19 DIAGNOSIS — M5416 Radiculopathy, lumbar region: Secondary | ICD-10-CM | POA: Diagnosis not present

## 2018-08-19 DIAGNOSIS — M47816 Spondylosis without myelopathy or radiculopathy, lumbar region: Secondary | ICD-10-CM | POA: Diagnosis not present

## 2018-08-19 MED ORDER — BETAMETHASONE SOD PHOS & ACET 6 (3-3) MG/ML IJ SUSP
12.0000 mg | Freq: Once | INTRAMUSCULAR | Status: AC
  Administered 2018-08-19: 12 mg

## 2018-08-19 NOTE — Progress Notes (Signed)
 .  Numeric Pain Rating Scale and Functional Assessment Average Pain 10   In the last MONTH (on 0-10 scale) has pain interfered with the following?  1. General activity like being  able to carry out your everyday physical activities such as walking, climbing stairs, carrying groceries, or moving a chair?  Rating(4)   +Driver, -BT, -Dye Allergies.

## 2018-08-19 NOTE — Patient Instructions (Signed)

## 2018-08-19 NOTE — Procedures (Signed)
Lumbosacral Transforaminal Epidural Steroid Injection - Sub-Pedicular Approach with Fluoroscopic Guidance  Patient: [name]      Date of Birth: 01/13/1976 MRN: [id number] PCP: Leanna Battles, MD      Visit Date: 08/19/2018   Universal Protocol:    Date/Time: 08/19/2018  Consent Given By: the patient  Position: PRONE  Additional Comments: Vital signs were monitored before and after the procedure. Patient was prepped and draped in the usual sterile fashion. The correct patient, procedure, and site was verified.   Injection Procedure Details:  Procedure Site One Meds Administered:  Meds ordered this encounter  Medications  . betamethasone acetate-betamethasone sodium phosphate (CELESTONE) injection 12 mg    Laterality: Left  Location/Site:  L5-S1  Needle size: 22 G  Needle type: Spinal  Needle Placement: Transforaminal  Findings:    -Comments: Excellent flow of contrast along the nerve and into the epidural space.  Procedure Details: After squaring off the end-plates to get a true AP view, the C-arm was positioned so that an oblique view of the foramen as noted above was visualized. The target area is just inferior to the "nose of the scotty dog" or sub pedicular. The soft tissues overlying this structure were infiltrated with 2-3 ml. of 1% Lidocaine without Epinephrine.  The spinal needle was inserted toward the target using a "trajectory" view along the fluoroscope beam.  Under AP and lateral visualization, the needle was advanced so it did not puncture dura and was located close the 6 O'Clock position of the pedical in AP tracterory. Biplanar projections were used to confirm position. Aspiration was confirmed to be negative for CSF and/or blood. A 1-2 ml. volume of Isovue-250 was injected and flow of contrast was noted at each level. Radiographs were obtained for documentation purposes.   After attaining the desired flow of contrast documented above,  a 0.5 to 1.0 ml test dose of 0.25% Marcaine was injected into each respective transforaminal space.  The patient was observed for 90 seconds post injection.  After no sensory deficits were reported, and normal lower extremity motor function was noted,   the above injectate was administered so that equal amounts of the injectate were placed at each foramen (level) into the transforaminal epidural space.   Additional Comments:  The patient tolerated the procedure well Dressing: Band-Aid    Post-procedure details: Patient was observed during the procedure. Post-procedure instructions were reviewed.  Patient left the clinic in stable condition.

## 2018-09-04 ENCOUNTER — Encounter (INDEPENDENT_AMBULATORY_CARE_PROVIDER_SITE_OTHER): Payer: Self-pay | Admitting: Physical Medicine and Rehabilitation

## 2018-09-04 ENCOUNTER — Ambulatory Visit (INDEPENDENT_AMBULATORY_CARE_PROVIDER_SITE_OTHER): Payer: BLUE CROSS/BLUE SHIELD | Admitting: Physical Medicine and Rehabilitation

## 2018-09-04 VITALS — BP 110/70 | HR 76

## 2018-09-04 DIAGNOSIS — M5416 Radiculopathy, lumbar region: Secondary | ICD-10-CM

## 2018-09-04 DIAGNOSIS — M47816 Spondylosis without myelopathy or radiculopathy, lumbar region: Secondary | ICD-10-CM | POA: Diagnosis not present

## 2018-09-04 DIAGNOSIS — M5442 Lumbago with sciatica, left side: Secondary | ICD-10-CM

## 2018-09-04 DIAGNOSIS — G8929 Other chronic pain: Secondary | ICD-10-CM | POA: Diagnosis not present

## 2018-09-04 NOTE — Progress Notes (Signed)
  Numeric Pain Rating Scale and Functional Assessment Average Pain 8   In the last MONTH (on 0-10 scale) has pain interfered with the following?  1. General activity like being  able to carry out your everyday physical activities such as walking, climbing stairs, carrying groceries, or moving a chair?  Rating(6)     

## 2018-09-05 ENCOUNTER — Encounter (INDEPENDENT_AMBULATORY_CARE_PROVIDER_SITE_OTHER): Payer: Self-pay | Admitting: Physical Medicine and Rehabilitation

## 2018-09-05 NOTE — Progress Notes (Signed)
[name] - 42 y.o. female MRN [id number]  Date of birth: Oct 15, 1975  Office Visit Note: Visit Date: 09/04/2018 PCP: Leanna Battles, MD Referred by: Leanna Battles, MD  Subjective: Chief Complaint  Patient presents with  . Lower Back - Pain   HPI: [name] is a 42 y.o. female who comes in today For evaluation and management for left-sided low back pain which is chronic severe and recalcitrant to conservative care which is included chiropractic care and medication management.  She is not really a big medication management type person but has used some anti-inflammatories without relief.  She has not had specific physical therapy.  She has had chiropractic care.  She still reports left-sided low back pain with some referral pattern in the hip and leg.  We completed left L5 transforaminal epidural steroid injection a few weeks ago and she states that she got 75% relief for a little while and then it slowly started to return back to her original pain.  She still somewhat better overall.  Pain level has dropped at least 2 on the numeric score.  She does feel somewhat better.  She has MRI findings of facet arthropathy at L5-S1 with foraminal narrowing moderate on the left compared to right.  Interestingly x-ray imaging shows probably a little bit more foraminal narrowing at least from a gestalt view of the x-ray.  We also has some difficulty entering the foramen on the injection but did get good flow of contrast.  She is had no new symptoms since of seen her.  She has not been doing much in the way of exercise because of increased pain.  She did report headache post procedure which was probably steroid induced headache.  This did resolve.  Review of Systems  Constitutional: Negative for chills, fever, malaise/fatigue and weight loss.  HENT: Negative for hearing loss and sinus pain.   Eyes: Negative for blurred vision, double vision and photophobia.  Respiratory:  Negative for cough and shortness of breath.   Cardiovascular: Negative for chest pain, palpitations and leg swelling.  Gastrointestinal: Negative for abdominal pain, nausea and vomiting.  Genitourinary: Negative for flank pain.  Musculoskeletal: Positive for back pain and joint pain. Negative for myalgias.  Skin: Negative for itching and rash.  Neurological: Negative for tremors, focal weakness and weakness.  Endo/Heme/Allergies: Negative.   Psychiatric/Behavioral: Negative for depression.  All other systems reviewed and are negative.  Otherwise per HPI.  Assessment & Plan: Visit Diagnoses:  1. Lumbar radiculopathy   2. Chronic left-sided low back pain with left-sided sciatica   3. Spondylosis without myelopathy or radiculopathy, lumbar region     Plan: Findings:  Chronic worsening left-sided low back pain and buttock and hip pain.  This seems to be related to the L5 nerve root with foraminal narrowing.  Could still have some relationship to facet joint arthritis.  I think the best step is to repeat the epidural injection and try to get more relief for her and then see where she is at at that point.  I would regroup with a physical therapist for a short course looking at core strengthening and think she can do at home.  She does not have anything this really a surgical lesion although there is some foraminal narrowing.  Foraminotomies gets could be considered but she really does not want to look at that at this point.  She does have BMI 44.1.  It would be nice to see if we get her some relief  so she could exercise.  Another avenue at some point would be medication type management and we discussed the use of anti-inflammatories with her.  Would consider something like Lyrica or gabapentin or possibly Cymbalta.  Pain history is somewhat complicated by history of anxiety.    Meds & Orders: No orders of the defined types were placed in this encounter.  No orders of the defined types were placed in  this encounter.   Follow-up: Return for Repeat left L5 transforaminal epidural steroid injection.   Procedures: No procedures performed  No notes on file   Clinical History: Acute Interface, Incoming Rad Results - 07/23/2018  2:38 PM EDT INDICATION: Dorsalgia, unspecified COMPARISON: None. TECHNIQUE: Multiplanar, multisequence MR imaging obtained through the lumbar spine without contrast.  FINDINGS:  Bones:  Vertebral body heights are maintained. No acute fracture. Small L1 hemangioma. Spinal cord/conus: No abnormal signal or mass.  Conus terminates at normal level. Alignment: No significant subluxation. Mild rotary levoscoliosis.  Degenerative changes: T12-L1: No significant foraminal or central canal stenosis.  No disc herniation.    L1-L2: Minimal disc bulge. Trace effacement of the anterior aspect thecal sac. Mild bilateral foraminal stenosis.   L2-L3: Minimal disc bulge with trace effacement anterior aspect of thecal sac. Mild facet degenerative changes.    L3-L4: Small disc bulge. Mild facet degenerative changes. Mild central canal stenosis. Mild bilateral foraminal stenosis.   L4-L5: Small disc bulge. Mild facet degenerative changes. Mild central canal stenosis. Mild bilateral foraminal stenosis.    L5-S1: Mild left greater than right facet degenerative changes. Mild right and moderate left foraminal stenosis.  Paraspinal soft tissues: Unremarkable  Incidental:  None.   IMPRESSION: L5/S27moderate left and mild right foraminal stenosis, secondary to facet degenerative changes.  L3/L4 and L4/L5 mild central canal and mild bilateral foraminal stenosis secondary to small disc bulges mild facet degenerative changes.    Mild rotary levoscoliosis.   She reports that she has never smoked. She has never used smokeless tobacco. No results for input(s): HGBA1C, LABURIC in the last 8760 hours.  Objective:  VS:  HT:    WT:   BMI:     BP:110/70  HR:76bpm  TEMP: ( )  RESP:   Physical Exam  Constitutional: She is oriented to person, place, and time. She appears well-developed and well-nourished.  Obese  Eyes: Pupils are equal, round, and reactive to light. Conjunctivae and EOM are normal.  Cardiovascular: Normal rate and intact distal pulses.  Pulmonary/Chest: Effort normal.  Musculoskeletal:  Patient ambulates without aid.  She does have some pain with extension and facet joint loading of the lumbar spine.  Mild tenderness over the left greater trochanter compared to right.  She has good distal strength.  Neurological: She is alert and oriented to person, place, and time. She exhibits normal muscle tone.  Skin: Skin is warm and dry. No rash noted. No erythema.  Psychiatric: She has a normal mood and affect. Her behavior is normal.  Nursing note and vitals reviewed.   Ortho Exam Imaging: No results found.  Past Medical/Family/Surgical/Social History: Medications & Allergies reviewed per EMR, new medications updated. Patient Active Problem List   Diagnosis Date Noted  . Morbid obesity with BMI of 40.0-44.9, adult ([place]) 05/17/2016  . CHOLELITHIASIS 12/24/2007   History reviewed. No pertinent past medical history. History reviewed. No pertinent family history. Past Surgical History:  Procedure Laterality Date  . CHOLECYSTECTOMY     Social History   Occupational History  . Not on file  Tobacco  Use  . Smoking status: Never Smoker  . Smokeless tobacco: Never Used  Substance and Sexual Activity  . Alcohol use: Not on file  . Drug use: Not on file  . Sexual activity: Not on file

## 2018-09-06 ENCOUNTER — Ambulatory Visit (INDEPENDENT_AMBULATORY_CARE_PROVIDER_SITE_OTHER): Payer: Self-pay | Admitting: Physical Medicine and Rehabilitation

## 2018-09-06 ENCOUNTER — Telehealth (INDEPENDENT_AMBULATORY_CARE_PROVIDER_SITE_OTHER): Payer: Self-pay | Admitting: *Deleted

## 2018-09-06 DIAGNOSIS — J029 Acute pharyngitis, unspecified: Secondary | ICD-10-CM | POA: Diagnosis not present

## 2018-09-06 DIAGNOSIS — J069 Acute upper respiratory infection, unspecified: Secondary | ICD-10-CM | POA: Diagnosis not present

## 2018-09-06 DIAGNOSIS — Z6841 Body Mass Index (BMI) 40.0 and over, adult: Secondary | ICD-10-CM | POA: Diagnosis not present

## 2018-09-06 NOTE — Telephone Encounter (Signed)
Spoke with Synasia from [facility] and No PA is needed for [phone]. Reference # [id number]

## 2018-09-09 ENCOUNTER — Ambulatory Visit (INDEPENDENT_AMBULATORY_CARE_PROVIDER_SITE_OTHER): Payer: BLUE CROSS/BLUE SHIELD | Admitting: Physical Medicine and Rehabilitation

## 2018-09-09 ENCOUNTER — Ambulatory Visit (INDEPENDENT_AMBULATORY_CARE_PROVIDER_SITE_OTHER): Payer: Self-pay

## 2018-09-09 ENCOUNTER — Encounter (INDEPENDENT_AMBULATORY_CARE_PROVIDER_SITE_OTHER): Payer: Self-pay | Admitting: Physical Medicine and Rehabilitation

## 2018-09-09 VITALS — BP 106/60 | HR 63 | Temp 98.0°F

## 2018-09-09 DIAGNOSIS — M5416 Radiculopathy, lumbar region: Secondary | ICD-10-CM

## 2018-09-09 DIAGNOSIS — M48061 Spinal stenosis, lumbar region without neurogenic claudication: Secondary | ICD-10-CM

## 2018-09-09 MED ORDER — BETAMETHASONE SOD PHOS & ACET 6 (3-3) MG/ML IJ SUSP
12.0000 mg | Freq: Once | INTRAMUSCULAR | Status: AC
  Administered 2018-09-09: 12 mg

## 2018-09-09 NOTE — Progress Notes (Signed)
 .  Numeric Pain Rating Scale and Functional Assessment Average Pain 8   In the last MONTH (on 0-10 scale) has pain interfered with the following?  1. General activity like being  able to carry out your everyday physical activities such as walking, climbing stairs, carrying groceries, or moving a chair?  Rating(7)   +Driver, -BT, -Dye Allergies.  

## 2018-09-09 NOTE — Patient Instructions (Signed)

## 2018-09-12 DIAGNOSIS — M5416 Radiculopathy, lumbar region: Secondary | ICD-10-CM | POA: Insufficient documentation

## 2018-09-12 DIAGNOSIS — M48061 Spinal stenosis, lumbar region without neurogenic claudication: Secondary | ICD-10-CM | POA: Insufficient documentation

## 2018-09-12 NOTE — Procedures (Signed)
Lumbosacral Transforaminal Epidural Steroid Injection - Sub-Pedicular Approach with Fluoroscopic Guidance  Patient: [name]      Date of Birth: 1976/03/04 MRN: [id number] PCP: Leanna Battles, MD      Visit Date: 09/09/2018   Universal Protocol:    Date/Time: 09/09/2018  Consent Given By: the patient  Position: PRONE  Additional Comments: Vital signs were monitored before and after the procedure. Patient was prepped and draped in the usual sterile fashion. The correct patient, procedure, and site was verified.   Injection Procedure Details:  Procedure Site One Meds Administered:  Meds ordered this encounter  Medications  . betamethasone acetate-betamethasone sodium phosphate (CELESTONE) injection 12 mg    Laterality: Left  Location/Site:  L5-S1  Needle size: 22 G  Needle type: Spinal  Needle Placement: Transforaminal  Findings:    -Comments: Excellent flow of contrast along the nerve and into the epidural space.  Procedure Details: After squaring off the end-plates to get a true AP view, the C-arm was positioned so that an oblique view of the foramen as noted above was visualized. The target area is just inferior to the "nose of the scotty dog" or sub pedicular. The soft tissues overlying this structure were infiltrated with 2-3 ml. of 1% Lidocaine without Epinephrine.  The spinal needle was inserted toward the target using a "trajectory" view along the fluoroscope beam.  Under AP and lateral visualization, the needle was advanced so it did not puncture dura and was located close the 6 O'Clock position of the pedical in AP tracterory. Biplanar projections were used to confirm position. Aspiration was confirmed to be negative for CSF and/or blood. A 1-2 ml. volume of Isovue-250 was injected and flow of contrast was noted at each level. Radiographs were obtained for documentation purposes.   After attaining the desired flow of contrast documented above,  a 0.5 to 1.0 ml test dose of 0.25% Marcaine was injected into each respective transforaminal space.  The patient was observed for 90 seconds post injection.  After no sensory deficits were reported, and normal lower extremity motor function was noted,   the above injectate was administered so that equal amounts of the injectate were placed at each foramen (level) into the transforaminal epidural space.   Additional Comments:  The patient tolerated the procedure well Dressing: Band-Aid    Post-procedure details: Patient was observed during the procedure. Post-procedure instructions were reviewed.  Patient left the clinic in stable condition.

## 2018-09-12 NOTE — Progress Notes (Signed)
[name] - 42 y.o. female MRN [id number]  Date of birth: December 14, 1975  Office Visit Note: Visit Date: 08/19/2018 PCP: Leanna Battles, MD Referred by: No ref. provider found  Subjective: Chief Complaint  Patient presents with  . Lower Back - Pain  . Left Leg - Pain   HPI: [name] is a 42 y.o. female who comes in today For evaluation and management of chronic severe worsening low back and left hip and leg pain at the request of Dr. Valetta Fuller.  Patient reports 10 out of 10 severe pain although fairly pleasant today in the exam room and does not appear in a great deal of pain sitting.  She does report worsening with standing and walking.  She reports several months of just progressively worsening left buttocks and left leg pain that refers down to the foot.  She gets some numbness in the left foot in a nondermatomal fashion.  She denies any symptoms on the right leg or in the foot but does get pain across the lower back.  She reports that nothing at this point helps the pain.  Dr. Sharlett Iles has been treating her conservatively and she has had some chiropractic work in the past without much relief.  She has had no prior history of spine surgery.  She is obese with BMI 44.1.  She is not actively doing any core strengthening or back exercises.  She does try to maintain activity level but the pain has been pretty bothersome.  She denies any focal weakness or bowel or bladder changes or fevers chills or night sweats or night pain.  She has had no other neurologic complaints.  No other paresthesias or balance difficulties or vision difficulties.  She has not had specific physical therapy.  She is tried muscle relaxers and anti-inflammatories without much relief.  No history of spine interventional procedures.  History complicated somewhat by history of anxiety.  Review of Systems  Constitutional: Negative for chills, fever, malaise/fatigue and weight loss.  HENT:  Negative for hearing loss and sinus pain.   Eyes: Negative for blurred vision, double vision and photophobia.  Respiratory: Negative for cough and shortness of breath.   Cardiovascular: Negative for chest pain, palpitations and leg swelling.  Gastrointestinal: Negative for abdominal pain, nausea and vomiting.  Genitourinary: Negative for flank pain.  Musculoskeletal: Positive for back pain. Negative for myalgias.       Left hip and leg pain  Skin: Negative for itching and rash.  Neurological: Positive for tingling. Negative for tremors, focal weakness and weakness.  Endo/Heme/Allergies: Negative.   Psychiatric/Behavioral: Negative for depression.  All other systems reviewed and are negative.  Otherwise per HPI.  Assessment & Plan: Visit Diagnoses:  1. Lumbar radiculopathy   2. 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  MRI reviewed with the patient today showing very minimal changes and actually a fairly good normal MRI other than the left-sided foraminal narrowing at L5-S1 Sharol Given some facet degenerative change.  No focal nerve compression or central stenosis.  No disc herniations.  Her pain seems to be out of proportion for the pathology seen on the imaging and given the exam.  Patient also seems fairly pleasant today given the rating pain on average as a 10 out of 10 but again it is  worse with standing and walking.  Her symptoms are very consistent for an L5 radiculitis likely due to the foraminal narrowing although it is moderate.  There may be some underlying central sensitization pain syndrome such as a fibromyalgia.  I think given the severity of the symptoms and the fact that the symptom location does correlate with imaging a diagnostic L5 transforaminal injection should be performed.  She is also going to  obtain CD of the imaging since this was done through [place].  Sometimes the report either over calls are under because the level of narrowing.    Meds & Orders:  Meds ordered this encounter  Medications  . betamethasone acetate-betamethasone sodium phosphate (CELESTONE) injection 12 mg    Orders Placed This Encounter  Procedures  . XR C-ARM NO REPORT  . Epidural Steroid injection    Follow-up: Return in about 4 weeks (around 09/16/2018).   Procedures: No procedures performed  Lumbosacral Transforaminal Epidural Steroid Injection - Sub-Pedicular Approach with Fluoroscopic Guidance  Patient: [name]      Date of Birth: Aug 06, 1976 MRN: [id number] PCP: Leanna Battles, MD      Visit Date: 08/19/2018   Universal Protocol:    Date/Time: 08/19/2018  Consent Given By: the patient  Position: PRONE  Additional Comments: Vital signs were monitored before and after the procedure. Patient was prepped and draped in the usual sterile fashion. The correct patient, procedure, and site was verified.   Injection Procedure Details:  Procedure Site One Meds Administered:  Meds ordered this encounter  Medications  . betamethasone acetate-betamethasone sodium phosphate (CELESTONE) injection 12 mg    Laterality: Left  Location/Site:  L5-S1  Needle size: 22 G  Needle type: Spinal  Needle Placement: Transforaminal  Findings:    -Comments: Excellent flow of contrast along the nerve and into the epidural space.  Procedure Details: After squaring off the end-plates to get a true AP view, the C-arm was positioned so that an oblique view of the foramen as noted above was visualized. The target area is just inferior to the "nose of the scotty dog" or sub pedicular. The soft tissues overlying this structure were infiltrated with 2-3 ml. of 1% Lidocaine without Epinephrine.  The spinal needle was inserted toward the target using a "trajectory" view along the fluoroscope  beam.  Under AP and lateral visualization, the needle was advanced so it did not puncture dura and was located close the 6 O'Clock position of the pedical in AP tracterory. Biplanar projections were used to confirm position. Aspiration was confirmed to be negative for CSF and/or blood. A 1-2 ml. volume of Isovue-250 was injected and flow of contrast was noted at each level. Radiographs were obtained for documentation purposes.   After attaining the desired flow of contrast documented above, a 0.5 to 1.0 ml test dose of 0.25% Marcaine was injected into each respective transforaminal space.  The patient was observed for 90 seconds post injection.  After no sensory deficits were reported, and normal lower extremity motor function was noted,   the above injectate was administered so that equal amounts of the injectate were placed at each foramen (level) into the transforaminal epidural space.   Additional Comments:  The patient tolerated the procedure well Dressing: Band-Aid    Post-procedure details: Patient was observed during the procedure. Post-procedure instructions were reviewed.  Patient left the clinic in stable condition.    Clinical History: Acute Interface, Incoming Rad Results - 07/23/2018  2:38 PM EDT INDICATION: Dorsalgia, unspecified COMPARISON:  None. TECHNIQUE: Multiplanar, multisequence MR imaging obtained through the lumbar spine without contrast.  FINDINGS:  Bones:  Vertebral body heights are maintained. No acute fracture. Small L1 hemangioma. Spinal cord/conus: No abnormal signal or mass.  Conus terminates at normal level. Alignment: No significant subluxation. Mild rotary levoscoliosis.  Degenerative changes: T12-L1: No significant foraminal or central canal stenosis.  No disc herniation.    L1-L2: Minimal disc bulge. Trace effacement of the anterior aspect thecal sac. Mild bilateral foraminal stenosis.   L2-L3: Minimal disc bulge with trace effacement anterior  aspect of thecal sac. Mild facet degenerative changes.    L3-L4: Small disc bulge. Mild facet degenerative changes. Mild central canal stenosis. Mild bilateral foraminal stenosis.   L4-L5: Small disc bulge. Mild facet degenerative changes. Mild central canal stenosis. Mild bilateral foraminal stenosis.    L5-S1: Mild left greater than right facet degenerative changes. Mild right and moderate left foraminal stenosis.  Paraspinal soft tissues: Unremarkable  Incidental:  None.   IMPRESSION: L5/S40moderate left and mild right foraminal stenosis, secondary to facet degenerative changes.  L3/L4 and L4/L5 mild central canal and mild bilateral foraminal stenosis secondary to small disc bulges mild facet degenerative changes.    Mild rotary levoscoliosis.   She reports that she has never smoked. She has never used smokeless tobacco. No results for input(s): HGBA1C, LABURIC in the last 8760 hours.  Objective:  VS:  HT:    WT:   BMI:     BP:122/65  HR:60bpm  TEMP:(!) 97.4 F (36.3 C)(Oral)  RESP:  Physical Exam Vitals signs and nursing note reviewed.  Constitutional:      General: She is not in acute distress.    Appearance: Normal appearance. She is well-developed. She is not ill-appearing.  HENT:     Head: Normocephalic and atraumatic.  Eyes:     Conjunctiva/sclera: Conjunctivae normal.     Pupils: Pupils are equal, round, and reactive to light.  Cardiovascular:     Rate and Rhythm: Normal rate.     Pulses: Normal pulses.  Pulmonary:     Effort: Pulmonary effort is normal.  Musculoskeletal:     Right lower leg: No edema.     Left lower leg: No edema.     Comments: Patient ambulates without a.  She has some difficulty going from sit to stand.  She has pain with facet joint loading left more than right.  She has no pain with hip rotation internal or external.  No pain over the greater trochanter and she has good distal strength without clonus.  Skin:    General: Skin is warm  and dry.     Findings: No erythema or rash.  Neurological:     General: No focal deficit present.     Mental Status: She is alert and oriented to person, place, and time.     Sensory: No sensory deficit.     Motor: No abnormal muscle tone.     Coordination: Coordination normal.     Gait: Gait normal.  Psychiatric:        Mood and Affect: Mood normal.        Behavior: Behavior normal.     Ortho Exam Imaging: No results found.  Past Medical/Family/Surgical/Social History: Medications & Allergies reviewed per EMR, new medications updated. Patient Active Problem List   Diagnosis Date Noted  . Morbid obesity with BMI of 40.0-44.9, adult ([place]) 05/17/2016  . CHOLELITHIASIS 12/24/2007   History reviewed. No pertinent past medical history. History reviewed. No pertinent  family history. Past Surgical History:  Procedure Laterality Date  . CHOLECYSTECTOMY     Social History   Occupational History  . Not on file  Tobacco Use  . Smoking status: Never Smoker  . Smokeless tobacco: Never Used  Substance and Sexual Activity  . Alcohol use: Not on file  . Drug use: Not on file  . Sexual activity: Not on file

## 2018-09-12 NOTE — Progress Notes (Signed)
[name] - 42 y.o. female MRN [id number]  Date of birth: 1975-12-03  Office Visit Note: Visit Date: 09/09/2018 PCP: Leanna Battles, MD Referred by: Leanna Battles, MD  Subjective: Chief Complaint  Patient presents with  . Lower Back - Pain  . Left Leg - Pain   HPI:  [name] is a 42 y.o. female who comes in today Plan repeat of left L5 transforaminal epidural steroid injection.  Please see our prior evaluation and management note for further details and justification.  ROS Otherwise per HPI.  Assessment & Plan: Visit Diagnoses:  1. Lumbar radiculopathy   2. Foraminal stenosis of lumbar region     Plan: No additional findings.   Meds & Orders:  Meds ordered this encounter  Medications  . betamethasone acetate-betamethasone sodium phosphate (CELESTONE) injection 12 mg    Orders Placed This Encounter  Procedures  . XR C-ARM NO REPORT  . Epidural Steroid injection    Follow-up: Return if symptoms worsen or fail to improve.   Procedures: No procedures performed  Lumbosacral Transforaminal Epidural Steroid Injection - Sub-Pedicular Approach with Fluoroscopic Guidance  Patient: [name]      Date of Birth: 1975/11/21 MRN: [id number] PCP: Leanna Battles, MD      Visit Date: 09/09/2018   Universal Protocol:    Date/Time: 09/09/2018  Consent Given By: the patient  Position: PRONE  Additional Comments: Vital signs were monitored before and after the procedure. Patient was prepped and draped in the usual sterile fashion. The correct patient, procedure, and site was verified.   Injection Procedure Details:  Procedure Site One Meds Administered:  Meds ordered this encounter  Medications  . betamethasone acetate-betamethasone sodium phosphate (CELESTONE) injection 12 mg    Laterality: Left  Location/Site:  L5-S1  Needle size: 22 G  Needle type: Spinal  Needle Placement: Transforaminal  Findings:     -Comments: Excellent flow of contrast along the nerve and into the epidural space.  Procedure Details: After squaring off the end-plates to get a true AP view, the C-arm was positioned so that an oblique view of the foramen as noted above was visualized. The target area is just inferior to the "nose of the scotty dog" or sub pedicular. The soft tissues overlying this structure were infiltrated with 2-3 ml. of 1% Lidocaine without Epinephrine.  The spinal needle was inserted toward the target using a "trajectory" view along the fluoroscope beam.  Under AP and lateral visualization, the needle was advanced so it did not puncture dura and was located close the 6 O'Clock position of the pedical in AP tracterory. Biplanar projections were used to confirm position. Aspiration was confirmed to be negative for CSF and/or blood. A 1-2 ml. volume of Isovue-250 was injected and flow of contrast was noted at each level. Radiographs were obtained for documentation purposes.   After attaining the desired flow of contrast documented above, a 0.5 to 1.0 ml test dose of 0.25% Marcaine was injected into each respective transforaminal space.  The patient was observed for 90 seconds post injection.  After no sensory deficits were reported, and normal lower extremity motor function was noted,   the above injectate was administered so that equal amounts of the injectate were placed at each foramen (level) into the transforaminal epidural space.   Additional Comments:  The patient tolerated the procedure well Dressing: Band-Aid    Post-procedure details: Patient was observed during the procedure. Post-procedure instructions were reviewed.  Patient left the clinic in  stable condition.    Clinical History: Acute Interface, Incoming Rad Results - 07/23/2018  2:38 PM EDT INDICATION: Dorsalgia, unspecified COMPARISON: None. TECHNIQUE: Multiplanar, multisequence MR imaging obtained through the lumbar spine without  contrast.  FINDINGS:  Bones:  Vertebral body heights are maintained. No acute fracture. Small L1 hemangioma. Spinal cord/conus: No abnormal signal or mass.  Conus terminates at normal level. Alignment: No significant subluxation. Mild rotary levoscoliosis.  Degenerative changes: T12-L1: No significant foraminal or central canal stenosis.  No disc herniation.    L1-L2: Minimal disc bulge. Trace effacement of the anterior aspect thecal sac. Mild bilateral foraminal stenosis.   L2-L3: Minimal disc bulge with trace effacement anterior aspect of thecal sac. Mild facet degenerative changes.    L3-L4: Small disc bulge. Mild facet degenerative changes. Mild central canal stenosis. Mild bilateral foraminal stenosis.   L4-L5: Small disc bulge. Mild facet degenerative changes. Mild central canal stenosis. Mild bilateral foraminal stenosis.    L5-S1: Mild left greater than right facet degenerative changes. Mild right and moderate left foraminal stenosis.  Paraspinal soft tissues: Unremarkable  Incidental:  None.   IMPRESSION: L5/S33moderate left and mild right foraminal stenosis, secondary to facet degenerative changes.  L3/L4 and L4/L5 mild central canal and mild bilateral foraminal stenosis secondary to small disc bulges mild facet degenerative changes.    Mild rotary levoscoliosis.     Objective:  VS:  HT:    WT:   BMI:     BP:106/60  HR:63bpm  TEMP:98 F (36.7 C)(Oral)  RESP:  Physical Exam  Ortho Exam Imaging: No results found.

## 2018-12-30 ENCOUNTER — Other Ambulatory Visit (INDEPENDENT_AMBULATORY_CARE_PROVIDER_SITE_OTHER): Payer: Self-pay | Admitting: Physical Medicine and Rehabilitation

## 2018-12-30 ENCOUNTER — Telehealth (INDEPENDENT_AMBULATORY_CARE_PROVIDER_SITE_OTHER): Payer: Self-pay | Admitting: Physical Medicine and Rehabilitation

## 2018-12-30 NOTE — Telephone Encounter (Signed)
Obviously we can repeat the injection at some point when they are starting to do those again.  In terms of pain relief get a sense from her what she has taken in the past that she is tolerated.  Happy to provide mild bit of hydrocodone.  Just let me know.

## 2018-12-30 NOTE — Telephone Encounter (Signed)
Can you call patient to advise/ schedule?

## 2018-12-31 ENCOUNTER — Telehealth (INDEPENDENT_AMBULATORY_CARE_PROVIDER_SITE_OTHER): Payer: Self-pay | Admitting: Physical Medicine and Rehabilitation

## 2018-12-31 NOTE — Telephone Encounter (Signed)
Patient has not really tried anything other than Advil and Aleve. She is willing to try hydrocodone. Pharmacy is correct.

## 2019-01-01 ENCOUNTER — Other Ambulatory Visit (INDEPENDENT_AMBULATORY_CARE_PROVIDER_SITE_OTHER): Payer: Self-pay | Admitting: Physical Medicine and Rehabilitation

## 2019-01-01 DIAGNOSIS — M5416 Radiculopathy, lumbar region: Secondary | ICD-10-CM

## 2019-01-01 DIAGNOSIS — M48061 Spinal stenosis, lumbar region without neurogenic claudication: Secondary | ICD-10-CM

## 2019-01-01 MED ORDER — HYDROCODONE-ACETAMINOPHEN 5-325 MG PO TABS: 1.0000 | ORAL_TABLET | Freq: Four times a day (QID) | ORAL | 0 refills | Status: AC | PRN

## 2019-01-01 NOTE — Telephone Encounter (Signed)
Tell her it says up to every 6 hours but she needs to space out more if needed

## 2019-01-01 NOTE — Telephone Encounter (Signed)
Called patient to advise  °

## 2019-01-01 NOTE — Progress Notes (Signed)
Ok for small initial amount of hydrocodone for radicular pain. PDMP checked.

## 2019-01-06 NOTE — Telephone Encounter (Signed)
Spoke with Audree Camel from [place] and she states no pa is needed for [phone]. Reference # [id number]

## 2019-02-06 ENCOUNTER — Encounter (INDEPENDENT_AMBULATORY_CARE_PROVIDER_SITE_OTHER): Payer: BLUE CROSS/BLUE SHIELD | Admitting: Physical Medicine and Rehabilitation

## 2019-02-11 ENCOUNTER — Ambulatory Visit: Payer: BLUE CROSS/BLUE SHIELD | Admitting: Physical Medicine and Rehabilitation

## 2019-02-11 ENCOUNTER — Ambulatory Visit: Payer: Self-pay

## 2019-02-11 ENCOUNTER — Encounter: Payer: Self-pay | Admitting: Physical Medicine and Rehabilitation

## 2019-02-11 ENCOUNTER — Telehealth: Payer: Self-pay | Admitting: *Deleted

## 2019-02-11 ENCOUNTER — Other Ambulatory Visit: Payer: Self-pay

## 2019-02-11 VITALS — BP 106/69 | HR 62

## 2019-02-11 DIAGNOSIS — M5416 Radiculopathy, lumbar region: Secondary | ICD-10-CM

## 2019-02-11 DIAGNOSIS — M48061 Spinal stenosis, lumbar region without neurogenic claudication: Secondary | ICD-10-CM

## 2019-02-11 MED ORDER — BETAMETHASONE SOD PHOS & ACET 6 (3-3) MG/ML IJ SUSP
12.0000 mg | Freq: Once | INTRAMUSCULAR | Status: AC
  Administered 2019-02-11: 16:00:00 12 mg

## 2019-02-11 NOTE — Telephone Encounter (Signed)
Spoke with Krista Blue and she states no pa is needed for [phone]. Reference # [id number]

## 2019-02-11 NOTE — Progress Notes (Signed)
.  Numeric Pain Rating Scale and Functional Assessment Average Pain 9   In the last MONTH (on 0-10 scale) has pain interfered with the following?  1. General activity like being  able to carry out your everyday physical activities such as walking, climbing stairs, carrying groceries, or moving a chair?  Rating(8)   +Driver, -BT, -Dye Allergies.  

## 2019-02-14 DIAGNOSIS — R7989 Other specified abnormal findings of blood chemistry: Secondary | ICD-10-CM | POA: Diagnosis not present

## 2019-02-14 DIAGNOSIS — R82998 Other abnormal findings in urine: Secondary | ICD-10-CM | POA: Diagnosis not present

## 2019-02-14 DIAGNOSIS — Z0000 Encounter for general adult medical examination without abnormal findings: Secondary | ICD-10-CM | POA: Diagnosis not present

## 2019-02-17 ENCOUNTER — Encounter: Payer: Self-pay | Admitting: Physical Medicine and Rehabilitation

## 2019-02-17 NOTE — Progress Notes (Signed)
[name] - 43 y.o. female MRN [id number]  Date of birth: May 05, 1976  Office Visit Note: Visit Date: 02/11/2019 PCP: Leanna Battles, MD Referred by: Leanna Battles, MD  Subjective: Chief Complaint  Patient presents with  . Left Foot - Numbness, Pain  . Left Leg - Pain   HPI:  [name] is a 43 y.o. female who comes in today For reevaluation management of low back pain and left hip and leg pain with paresthesias into the foot and somewhat classic L5 distribution.  Unfortunately we have completed 2 transforaminal epidural steroid injections for what appears to be foraminal stenosis moderate to severe on the left compared to right with good diagnostic value given that she gets a great amount of relief for about a week.  The symptoms return at that point and are very symptomatic.  She rates her pain as an average of 9 out of 10 fairly constant throbbing aching type pain with paresthesia.  Does affect her activities of daily living.  She has had a full gamut of chiropractic care and medication management.  She is not someone who really wants to undergo chronic pain medication.  She has had no focal weakness she has had no bowel or bladder dysfunction or other red flag complaints.  MRI has been performed.  Last injection again that we performed was of good benefit but did not last very long.  Her case is complicated by depression and anxiety as well as morbid obesity.  Review of Systems  Constitutional: Negative for chills, fever, malaise/fatigue and weight loss.  HENT: Negative for hearing loss and sinus pain.   Eyes: Negative for blurred vision, double vision and photophobia.  Respiratory: Negative for cough and shortness of breath.   Cardiovascular: Negative for chest pain, palpitations and leg swelling.  Gastrointestinal: Negative for abdominal pain, nausea and vomiting.  Genitourinary: Negative for flank pain.  Musculoskeletal: Positive for back pain.  Negative for myalgias.       Left hip and leg pain  Skin: Negative for itching and rash.  Neurological: Negative for tremors, focal weakness and weakness.  Endo/Heme/Allergies: Negative.   Psychiatric/Behavioral: Negative for depression.  All other systems reviewed and are negative.  Otherwise per HPI.  Assessment & Plan: Visit Diagnoses:  1. Lumbar radiculopathy   2. Foraminal stenosis of lumbar region     Plan: Findings:  Chronic worsening severe left hip and leg pain that seems to be related to the L5 foraminal narrowing.  L5 transforaminal injections have been pretty diagnostic with significant 75% relief for more for about a week but nothing after that.  I do not feel other interventions would be worthwhile as I feel like an epidural injection from an interlaminar approach probably would not help.  She is currently taking muscle relaxer and anti-inflammatory.  She might do well with adjunct of nerve pain type medication but is been a little bit reluctant to start that.  She does not want chronic opioid treatment.  I think the next best approach is to have her see a good physical therapist and will make referral for that.  She has had chiropractic care.  I also want her to see a neurosurgeon for evaluation just to see if there is anything that could be done there.  Again case complicated by morbid obesity.  We did make referral to neurosurgery.  We did talk to her about neural flossing and exercise on her own today.  No medication changes today.  Would consider  referral for electrodiagnostic study.  Would consider possible spinal cord stimulator trial depending on outcome.    Meds & Orders:  Meds ordered this encounter  Medications  . betamethasone acetate-betamethasone sodium phosphate (CELESTONE) injection 12 mg    Orders Placed This Encounter  Procedures  . Ambulatory referral to Neurosurgery  . Ambulatory referral to Physical Therapy    Follow-up: No follow-ups on file.    Procedures: No procedures performed  No notes on file   Clinical History: Acute Interface, Incoming Rad Results - 07/23/2018  2:38 PM EDT INDICATION: Dorsalgia, unspecified COMPARISON: None. TECHNIQUE: Multiplanar, multisequence MR imaging obtained through the lumbar spine without contrast.  FINDINGS:  Bones:  Vertebral body heights are maintained. No acute fracture. Small L1 hemangioma. Spinal cord/conus: No abnormal signal or mass.  Conus terminates at normal level. Alignment: No significant subluxation. Mild rotary levoscoliosis.  Degenerative changes: T12-L1: No significant foraminal or central canal stenosis.  No disc herniation.    L1-L2: Minimal disc bulge. Trace effacement of the anterior aspect thecal sac. Mild bilateral foraminal stenosis.   L2-L3: Minimal disc bulge with trace effacement anterior aspect of thecal sac. Mild facet degenerative changes.    L3-L4: Small disc bulge. Mild facet degenerative changes. Mild central canal stenosis. Mild bilateral foraminal stenosis.   L4-L5: Small disc bulge. Mild facet degenerative changes. Mild central canal stenosis. Mild bilateral foraminal stenosis.    L5-S1: Mild left greater than right facet degenerative changes. Mild right and moderate left foraminal stenosis.  Paraspinal soft tissues: Unremarkable  Incidental:  None.   IMPRESSION: L5/S44moderate left and mild right foraminal stenosis, secondary to facet degenerative changes.  L3/L4 and L4/L5 mild central canal and mild bilateral foraminal stenosis secondary to small disc bulges mild facet degenerative changes.    Mild rotary levoscoliosis.     Objective:  VS:  HT:    WT:   BMI:     BP:106/69  HR:62bpm  TEMP: ( )  RESP:  Physical Exam Vitals signs and nursing note reviewed.  Constitutional:      General: She is not in acute distress.    Appearance: Normal appearance. She is well-developed. She is obese.  HENT:     Head: Normocephalic and atraumatic.      Nose: Nose normal.     Mouth/Throat:     Mouth: Mucous membranes are moist.     Pharynx: Oropharynx is clear.  Eyes:     Conjunctiva/sclera: Conjunctivae normal.     Pupils: Pupils are equal, round, and reactive to light.  Neck:     Musculoskeletal: Normal range of motion and neck supple.  Cardiovascular:     Rate and Rhythm: Regular rhythm.  Pulmonary:     Effort: Pulmonary effort is normal. No respiratory distress.  Abdominal:     General: There is no distension.     Palpations: Abdomen is soft.     Tenderness: There is no guarding.  Musculoskeletal: Normal range of motion.        General: No swelling or deformity.     Right lower leg: No edema.     Left lower leg: No edema.     Comments: Patient ambulates without aid with a fairly normal gait.  She has some pain with extension of the lumbar spine she has a negative slump test bilaterally.  She has no clonus bilaterally and good distal strength.  Skin:    General: Skin is warm and dry.     Findings: No erythema or rash.  Neurological:  General: No focal deficit present.     Mental Status: She is alert and oriented to person, place, and time.     Motor: No abnormal muscle tone.     Coordination: Coordination normal.     Gait: Gait normal.  Psychiatric:        Mood and Affect: Mood normal.        Behavior: Behavior normal.        Thought Content: Thought content normal.     Ortho Exam Imaging: No results found.

## 2019-02-17 NOTE — Procedures (Deleted)
Lumbosacral Transforaminal Epidural Steroid Injection - Sub-Pedicular Approach with Fluoroscopic Guidance  Patient: [name]      Date of Birth: 01-30-1976 MRN: [id number] PCP: Leanna Battles, MD      Visit Date: 02/11/2019   Universal Protocol:    Date/Time: 02/11/2019  Consent Given By: the patient  Position: PRONE  Additional Comments: Vital signs were monitored before and after the procedure. Patient was prepped and draped in the usual sterile fashion. The correct patient, procedure, and site was verified.   Injection Procedure Details:  Procedure Site One Meds Administered:  Meds ordered this encounter  Medications  . betamethasone acetate-betamethasone sodium phosphate (CELESTONE) injection 12 mg    Laterality: Left  Location/Site:  L5-S1  Needle size: 22 G  Needle type: Spinal  Needle Placement: Transforaminal  Findings:    -Comments: Excellent flow of contrast along the nerve and into the epidural space.  Procedure Details: After squaring off the end-plates to get a true AP view, the C-arm was positioned so that an oblique view of the foramen as noted above was visualized. The target area is just inferior to the "nose of the scotty dog" or sub pedicular. The soft tissues overlying this structure were infiltrated with 2-3 ml. of 1% Lidocaine without Epinephrine.  The spinal needle was inserted toward the target using a "trajectory" view along the fluoroscope beam.  Under AP and lateral visualization, the needle was advanced so it did not puncture dura and was located close the 6 O'Clock position of the pedical in AP tracterory. Biplanar projections were used to confirm position. Aspiration was confirmed to be negative for CSF and/or blood. A 1-2 ml. volume of Isovue-250 was injected and flow of contrast was noted at each level. Radiographs were obtained for documentation purposes.   After attaining the desired flow of contrast documented above,  a 0.5 to 1.0 ml test dose of 0.25% Marcaine was injected into each respective transforaminal space.  The patient was observed for 90 seconds post injection.  After no sensory deficits were reported, and normal lower extremity motor function was noted,   the above injectate was administered so that equal amounts of the injectate were placed at each foramen (level) into the transforaminal epidural space.   Additional Comments:  The patient tolerated the procedure well Dressing: 2 x 2 sterile gauze and Band-Aid    Post-procedure details: Patient was observed during the procedure. Post-procedure instructions were reviewed.  Patient left the clinic in stable condition.

## 2019-02-20 DIAGNOSIS — F419 Anxiety disorder, unspecified: Secondary | ICD-10-CM | POA: Diagnosis not present

## 2019-02-20 DIAGNOSIS — Z0000 Encounter for general adult medical examination without abnormal findings: Secondary | ICD-10-CM | POA: Diagnosis not present

## 2019-02-20 DIAGNOSIS — M5416 Radiculopathy, lumbar region: Secondary | ICD-10-CM | POA: Diagnosis not present

## 2019-02-20 DIAGNOSIS — D696 Thrombocytopenia, unspecified: Secondary | ICD-10-CM | POA: Diagnosis not present

## 2019-02-20 DIAGNOSIS — Z1339 Encounter for screening examination for other mental health and behavioral disorders: Secondary | ICD-10-CM | POA: Diagnosis not present

## 2019-02-20 DIAGNOSIS — Z1331 Encounter for screening for depression: Secondary | ICD-10-CM | POA: Diagnosis not present

## 2019-03-04 DIAGNOSIS — M5416 Radiculopathy, lumbar region: Secondary | ICD-10-CM | POA: Diagnosis not present

## 2019-06-11 ENCOUNTER — Emergency Department: Payer: BC Managed Care – PPO

## 2019-06-11 ENCOUNTER — Emergency Department
Admission: EM | Admit: 2019-06-11 | Discharge: 2019-06-11 | Disposition: A | Discharge status: Home / Self Care | Payer: BC Managed Care – PPO | Attending: Emergency Medicine | Admitting: Emergency Medicine

## 2019-06-11 ENCOUNTER — Other Ambulatory Visit: Payer: Self-pay

## 2019-06-11 ENCOUNTER — Encounter: Payer: Self-pay | Admitting: Emergency Medicine

## 2019-06-11 DIAGNOSIS — M791 Myalgia, unspecified site: Secondary | ICD-10-CM | POA: Diagnosis not present

## 2019-06-11 DIAGNOSIS — E039 Hypothyroidism, unspecified: Secondary | ICD-10-CM | POA: Insufficient documentation

## 2019-06-11 DIAGNOSIS — R0789 Other chest pain: Secondary | ICD-10-CM

## 2019-06-11 DIAGNOSIS — R0602 Shortness of breath: Secondary | ICD-10-CM | POA: Diagnosis not present

## 2019-06-11 DIAGNOSIS — R079 Chest pain, unspecified: Secondary | ICD-10-CM | POA: Diagnosis not present

## 2019-06-11 HISTORY — DX: Disorder of thyroid, unspecified: E07.9

## 2019-06-11 LAB — CBC
HCT: 40.2 % (ref 36.0–46.0)
Hemoglobin: 13.9 g/dL (ref 12.0–15.0)
MCH: 30.5 pg (ref 26.0–34.0)
MCHC: 34.6 g/dL (ref 30.0–36.0)
MCV: 88.4 fL (ref 80.0–100.0)
Platelets: 162 10*3/uL (ref 150–400)
RBC: 4.55 MIL/uL (ref 3.87–5.11)
RDW: 11.9 % (ref 11.5–15.5)
WBC: 4.1 10*3/uL (ref 4.0–10.5)
nRBC: 0 % (ref 0.0–0.2)

## 2019-06-11 LAB — BASIC METABOLIC PANEL
Anion gap: 11 (ref 5–15)
BUN: 12 mg/dL (ref 6–20)
CO2: 22 mmol/L (ref 22–32)
Calcium: 8.8 mg/dL — ABNORMAL LOW (ref 8.9–10.3)
Chloride: 105 mmol/L (ref 98–111)
Creatinine, Ser: 0.77 mg/dL (ref 0.44–1.00)
GFR calc Af Amer: >60 mL/min (ref >60)
GFR calc non Af Amer: >60 mL/min (ref >60)
Glucose, Bld: 121 mg/dL — ABNORMAL HIGH (ref 70–99)
Potassium: 3.4 mmol/L — ABNORMAL LOW (ref 3.5–5.1)
Sodium: 138 mmol/L (ref 135–145)

## 2019-06-11 LAB — POCT PREGNANCY, URINE: Preg Test, Ur: NEGATIVE

## 2019-06-11 LAB — TROPONIN I (HIGH SENSITIVITY): Troponin I (High Sensitivity): <2 ng/L (ref <18)

## 2019-06-11 MED ORDER — IPRATROPIUM-ALBUTEROL 0.5-2.5 (3) MG/3ML IN SOLN
3.0000 mL | Freq: Once | RESPIRATORY_TRACT | Status: AC
  Administered 2019-06-11: 3 mL via RESPIRATORY_TRACT
  Filled 2019-06-11: qty 3

## 2019-06-11 MED ORDER — SODIUM CHLORIDE 0.9% FLUSH: 3.0000 mL | Freq: Once | INTRAVENOUS | Status: DC

## 2019-06-11 NOTE — ED Provider Notes (Signed)
[facility] Emergency Department Provider Note   ____________________________________________    I have reviewed the triage vital signs and the nursing notes.   HISTORY  Chief Complaint Shortness of Breath, Chest Pain, and Generalized Body Aches     HPI [name] is a 43 y.o. female who presents with complaints of intermittent sensation of having to take a deep breath and occasional chest tightness.  Patient reports this is been occurring since she started Synthroid for hypothyroidism last week.  She notes that occasionally she feels that she has to take a deeper breath than typical.  She denies pleurisy.  No shortness of breath.  Occasional chest tightness as well.  No nausea or vomiting.  PCP referred her for evaluation  Past Medical History:  Diagnosis Date  . Thyroid disease     Patient Active Problem List   Diagnosis Date Noted  . Lumbar radiculopathy 09/12/2018  . Foraminal stenosis of lumbar region 09/12/2018  . Morbid obesity with BMI of 40.0-44.9, adult ([place]) 05/17/2016  . CHOLELITHIASIS 12/24/2007    Past Surgical History:  Procedure Laterality Date  . CESAREAN SECTION WITH BILATERAL TUBAL LIGATION    . CHOLECYSTECTOMY      Prior to Admission medications   Medication Sig Start Date End Date Taking? Authorizing Provider  ALPRAZolam Duanne Moron) 0.5 MG tablet  07/18/18   [provider]  buPROPion (WELLBUTRIN SR) 100 MG 12 hr tablet bupropion HCl SR 100 mg tablet,12 hr sustained-release    [provider]  meloxicam (MOBIC) 15 MG tablet  07/05/18   [provider]  methocarbamol (ROBAXIN) 750 MG tablet  07/05/18   [provider]  ondansetron (ZOFRAN ODT) 8 MG disintegrating tablet Take 1 tablet (8 mg total) by mouth every 8 (eight) hours as needed for nausea. 06/20/12   Pattricia Boss, MD  pantoprazole (PROTONIX) 20 MG tablet Take 2 tablets (40 mg total) by mouth daily. 06/20/12   Pattricia Boss, MD     Allergies Patient has no known allergies.  No family history on file.  Social History Social History   Tobacco Use  . Smoking status: Never Smoker  . Smokeless tobacco: Never Used  Substance Use Topics  . Alcohol use: Not on file  . Drug use: Not on file    Review of Systems  Constitutional: No fever/chills Eyes: No visual changes.  ENT: No sore throat. Cardiovascular: As above Respiratory: As above Gastrointestinal: No abdominal pain.  No nausea, no vomiting.   Genitourinary: Negative for dysuria. Musculoskeletal: Negative for back pain. Skin: Negative for rash. Neurological: Negative for headaches or weakness   ____________________________________________   PHYSICAL EXAM:  VITAL SIGNS: ED Triage Vitals  Enc Vitals Group     BP 06/11/19 1440 136/77     Pulse Rate 06/11/19 1440 86     Resp 06/11/19 1440 19     Temp 06/11/19 1440 98.3 F (36.8 C)     Temp Source 06/11/19 1440 Oral     SpO2 06/11/19 1440 96 %     Weight 06/11/19 1440 122 kg (269 lb)     Height 06/11/19 1440 1.651 m (5\' 5" )     Head Circumference --      Peak Flow --      Pain Score 06/11/19 1849 0     Pain Loc --      Pain Edu? --      Excl. in [place]? --     Constitutional: Alert and  oriented. No acute distress Eyes: Conjunctivae are normal.   Nose: No congestion/rhinnorhea. Mouth/Throat: Mucous membranes are moist.    Cardiovascular: Normal rate, regular rhythm. Grossly normal heart sounds.  Good peripheral circulation. Respiratory: Normal respiratory effort.  No retractions. Lungs CTAB. Gastrointestinal: Soft and nontender. No distention.  No CVA tenderness.  Musculoskeletal: No lower extremity tenderness nor edema.  Warm and well perfused Neurologic:  Normal speech and language. No gross focal neurologic deficits are appreciated.  Skin:  Skin is warm, dry and intact. No rash noted. Psychiatric: Mood and affect are normal. Speech and behavior are normal.   ____________________________________________   LABS (all labs ordered are listed, but only abnormal results are displayed)  Labs Reviewed  BASIC METABOLIC PANEL - Abnormal; Notable for the following components:      Result Value   Potassium 3.4 (*)    Glucose, Bld 121 (*)    Calcium 8.8 (*)    All other components within normal limits  CBC  POC URINE PREG, ED  POCT PREGNANCY, URINE  TROPONIN I (HIGH SENSITIVITY)   ____________________________________________  EKG  ED ECG REPORT I, Lavonia Drafts, the attending physician, personally viewed and interpreted this ECG.  Date: 06/11/2019  Rhythm: normal sinus rhythm QRS Axis: normal Intervals: normal ST/T Wave abnormalities: normal Narrative Interpretation: no evidence of acute ischemia  ____________________________________________  RADIOLOGY  Chest x-ray unremarkable ____________________________________________   PROCEDURES  Procedure(s) performed: No  Procedures   Critical Care performed: No ____________________________________________   INITIAL IMPRESSION / ASSESSMENT AND PLAN / ED COURSE  Pertinent labs & imaging results that were available during my care of the patient were reviewed by me and considered in my medical decision making (see chart for details).  Patient presents with above complaints, overall quite well-appearing in no acute distress, normal respirations, normal exam.  No pleurisy, no calf pain or swelling to suggest PE.  Chest x-ray unremarkable, EKG normal, lab work normal, troponin normal.  Treated with DuoNeb to see if possibly some mild bronchospasm this did seem to help somewhat.  Patient also appears to be significantly anxious and I suspect that is playing a role in her chest tightness.  Medication for admission at this time, she will follow-up with PCP    ____________________________________________   FINAL CLINICAL IMPRESSION(S) / ED DIAGNOSES  Final diagnoses:  Atypical chest  pain        Note:  This document was prepared using Dragon voice recognition software and may include unintentional dictation errors.   Lavonia Drafts, MD 06/11/19 2019

## 2019-06-11 NOTE — ED Triage Notes (Signed)
PT to ER via POV states she started a new thyroid medication last week and since that time has had chest tightness, SHOB and generally not feeling well.  States she called PCP who advised she come be evaluated.

## 2019-07-14 DIAGNOSIS — E7849 Other hyperlipidemia: Secondary | ICD-10-CM | POA: Diagnosis not present

## 2019-08-24 DIAGNOSIS — Z20828 Contact with and (suspected) exposure to other viral communicable diseases: Secondary | ICD-10-CM | POA: Diagnosis not present

## 2019-09-02 DIAGNOSIS — M5416 Radiculopathy, lumbar region: Secondary | ICD-10-CM | POA: Diagnosis not present

## 2019-09-04 ENCOUNTER — Other Ambulatory Visit (HOSPITAL_COMMUNITY): Payer: Self-pay | Admitting: Neurological Surgery

## 2019-09-04 ENCOUNTER — Other Ambulatory Visit: Payer: Self-pay | Admitting: Neurological Surgery

## 2019-09-04 DIAGNOSIS — Z1159 Encounter for screening for other viral diseases: Secondary | ICD-10-CM | POA: Diagnosis not present

## 2019-09-04 DIAGNOSIS — M5416 Radiculopathy, lumbar region: Secondary | ICD-10-CM

## 2019-09-06 ENCOUNTER — Other Ambulatory Visit: Payer: Self-pay

## 2019-09-06 ENCOUNTER — Ambulatory Visit (HOSPITAL_COMMUNITY)
Admission: RE | Admit: 2019-09-06 | Discharge: 2019-09-06 | Disposition: A | Discharge status: Home / Self Care | Payer: BC Managed Care – PPO | Source: Ambulatory Visit | Attending: Neurological Surgery | Admitting: Neurological Surgery

## 2019-09-06 DIAGNOSIS — M5416 Radiculopathy, lumbar region: Secondary | ICD-10-CM | POA: Diagnosis not present

## 2019-09-06 DIAGNOSIS — M545 Low back pain: Secondary | ICD-10-CM | POA: Diagnosis not present

## 2019-09-09 DIAGNOSIS — M5416 Radiculopathy, lumbar region: Secondary | ICD-10-CM | POA: Diagnosis not present

## 2019-09-10 DIAGNOSIS — M7138 Other bursal cyst, other site: Secondary | ICD-10-CM | POA: Diagnosis not present

## 2019-09-10 DIAGNOSIS — M5416 Radiculopathy, lumbar region: Secondary | ICD-10-CM | POA: Diagnosis not present

## 2019-09-10 DIAGNOSIS — M5417 Radiculopathy, lumbosacral region: Secondary | ICD-10-CM | POA: Diagnosis not present

## 2019-09-10 DIAGNOSIS — M4807 Spinal stenosis, lumbosacral region: Secondary | ICD-10-CM | POA: Diagnosis not present

## 2020-02-20 DIAGNOSIS — Z0000 Encounter for general adult medical examination without abnormal findings: Secondary | ICD-10-CM | POA: Diagnosis not present

## 2020-02-20 DIAGNOSIS — E7849 Other hyperlipidemia: Secondary | ICD-10-CM | POA: Diagnosis not present

## 2020-02-27 DIAGNOSIS — F419 Anxiety disorder, unspecified: Secondary | ICD-10-CM | POA: Diagnosis not present

## 2020-02-27 DIAGNOSIS — M5416 Radiculopathy, lumbar region: Secondary | ICD-10-CM | POA: Diagnosis not present

## 2020-02-27 DIAGNOSIS — E785 Hyperlipidemia, unspecified: Secondary | ICD-10-CM | POA: Diagnosis not present

## 2020-02-27 DIAGNOSIS — Z1331 Encounter for screening for depression: Secondary | ICD-10-CM | POA: Diagnosis not present

## 2020-02-27 DIAGNOSIS — Z0000 Encounter for general adult medical examination without abnormal findings: Secondary | ICD-10-CM | POA: Diagnosis not present

## 2020-03-04 DIAGNOSIS — Z1212 Encounter for screening for malignant neoplasm of rectum: Secondary | ICD-10-CM | POA: Diagnosis not present

## 2020-03-29 ENCOUNTER — Ambulatory Visit: Payer: BC Managed Care – PPO | Admitting: Neurology

## 2020-03-29 ENCOUNTER — Encounter: Payer: Self-pay | Admitting: Neurology

## 2020-03-29 VITALS — BP 117/72 | HR 82 | Ht 65.0 in | Wt 272.0 lb

## 2020-03-29 DIAGNOSIS — R0683 Snoring: Secondary | ICD-10-CM | POA: Diagnosis not present

## 2020-03-29 DIAGNOSIS — R351 Nocturia: Secondary | ICD-10-CM | POA: Diagnosis not present

## 2020-03-29 DIAGNOSIS — G4719 Other hypersomnia: Secondary | ICD-10-CM

## 2020-03-29 DIAGNOSIS — R519 Headache, unspecified: Secondary | ICD-10-CM

## 2020-03-29 DIAGNOSIS — Z6841 Body Mass Index (BMI) 40.0 and over, adult: Secondary | ICD-10-CM

## 2020-03-29 DIAGNOSIS — R0681 Apnea, not elsewhere classified: Secondary | ICD-10-CM

## 2020-03-29 NOTE — Patient Instructions (Signed)

## 2020-03-29 NOTE — Progress Notes (Signed)
Subjective:    Patient ID: [name] is a 44 y.o. female.  HPI     Star Age, MD, PhD [facility] [address] [address] [place], [place] [address]  Dear Dr. Philip Aspen,   I saw your patient, [name], upon your kind request, in my sleep clinic today for Initial consultation of her sleep disorder, in particular, concern for underlying obstructive sleep apnea. The patient is unaccompanied today. As you know, [name], is a 44 year old right-handed woman with an underlying medical history of hyperlipidemia, thyroid disease, low back pain, reflux disease, and morbid obesity with a BMI of over 40, who reports snoring and nonrestorative sleep, as well as sleep disruption and witnessed apneas, per husband.  I reviewed your office note from 02/27/2020. Her Epworth sleepiness score is 14/24, fatigue severity score is 25/63.  She reports significant stress, she takes care of her elderly parents, her mother has been practically bedridden since November 2020.  Patient had back surgery in December 2020.  She was taking part in a weight loss trial and had been able to lose about 40 pounds but when her back problem started she gained most of it back.  She is back enrolled in a weight loss study.  Her snoring was less when she was able to lose weight.  She is hoping to get back on track with her physical activity.  She goes to bed around midnight and rise time is around 730, or sometimes close to 8.  She is able to work from home, works as a Freight forwarder for [employer].  She lives with her husband and 2 of her 3 sons, oldest 17 is on his own at age 26.  She has no family history of sleep apnea.  She is a non-smoker and drinks alcohol occasionally on the weekends, caffeine in the form of coffee, generally 2 cups in the morning and 1 or 2 soda servings per day on average.  She does go to the bathroom at night, on average 2-3 times, she has woken up  with a headache at times.  They have 2 dogs in the household, 1 sleeps with them in the bedroom.  She has a TV in the bedroom that she watches occasionally, does not have to have it on and there is another TV that her husband uses to play games on.  She is a mouth breather and since her back surgery she sleeps on one side or the other, typically not on her back or her stomach.    Her Past Medical History Is Significant For: Past Medical History:  Diagnosis Date  . Thyroid disease     Her Past Surgical History Is Significant For: Past Surgical History:  Procedure Laterality Date  . BACK SURGERY    . CESAREAN SECTION WITH BILATERAL TUBAL LIGATION    . CHOLECYSTECTOMY    . TUBAL LIGATION    . WISDOM TOOTH EXTRACTION      Her Family History Is Significant For: History reviewed. No pertinent family history.  Her Social History Is Significant For: Social History   Socioeconomic History  . Marital status: Married    Spouse name: Not on file  . Number of children: Not on file  . Years of education: Not on file  . Highest education level: Not on file  Occupational History  . Not on file  Tobacco Use  . Smoking status: Never Smoker  . Smokeless tobacco: Never Used  Substance  and Sexual Activity  . Alcohol use: Yes    Comment: 0-2 week  . Drug use: Never  . Sexual activity: Not on file  Other Topics Concern  . Not on file  Social History Narrative  . Not on file   Social Determinants of Health   Financial Resource Strain:   . Difficulty of Paying Living Expenses:   Food Insecurity:   . Worried About Charity fundraiser in the Last Year:   . Arboriculturist in the Last Year:   Transportation Needs:   . Film/video editor (Medical):   Marland Kitchen Lack of Transportation (Non-Medical):   Physical Activity:   . Days of Exercise per Week:   . Minutes of Exercise per Session:   Stress:   . Feeling of Stress :   Social Connections:   . Frequency of Communication with Friends and  Family:   . Frequency of Social Gatherings with Friends and Family:   . Attends Religious Services:   . Active Member of Clubs or Organizations:   . Attends Archivist Meetings:   Marland Kitchen Marital Status:     Her Allergies Are:  No Known Allergies:   Her Current Medications Are:  Outpatient Encounter Medications as of 03/29/2020  Medication Sig  . Biotin w/ Vitamins C & E (HAIR SKIN & NAILS GUMMIES PO) Take by mouth.  . Fexofenadine-Pseudoephedrine (ALLEGRA-D 24 HOUR PO) Take by mouth.  . levothyroxine (SYNTHROID) 25 MCG tablet Take 25 mcg by mouth daily before breakfast.  . Multiple Vitamin (MULTI-VITAMIN PO) Take by mouth.  Marland Kitchen UNABLE TO FIND Med Name: Tirzepatide clinical study  . [DISCONTINUED] ALPRAZolam (XANAX) 0.5 MG tablet  (Patient not taking: Reported on 03/29/2020)  . [DISCONTINUED] buPROPion (WELLBUTRIN SR) 100 MG 12 hr tablet bupropion HCl SR 100 mg tablet,12 hr sustained-release  . [DISCONTINUED] meloxicam (MOBIC) 15 MG tablet   . [DISCONTINUED] methocarbamol (ROBAXIN) 750 MG tablet   . [DISCONTINUED] ondansetron (ZOFRAN ODT) 8 MG disintegrating tablet Take 1 tablet (8 mg total) by mouth every 8 (eight) hours as needed for nausea.  . [DISCONTINUED] pantoprazole (PROTONIX) 20 MG tablet Take 2 tablets (40 mg total) by mouth daily. (Patient not taking: Reported on 03/29/2020)   No facility-administered encounter medications on file as of 03/29/2020.  :  Review of Systems:  Out of a complete 14 point review of systems, all are reviewed and negative with the exception of these symptoms as listed below: Review of Systems  Neurological:       Here for sleep consult. No prior sleep consult reports snoring/daytime fatigue are present.  Epworth Sleepiness Scale 0= would never doze 1= slight chance of dozing 2= moderate chance of dozing 3= high chance of dozing  Sitting and reading:3 Watching TV:2 Sitting inactive in a public place (ex. Theater or meeting):2 As a passenger  in a car for an hour without a break:3 Lying down to rest in the afternoon:2 Sitting and talking to someone:1 Sitting quietly after lunch (no alcohol):1 In a car, while stopped in traffic:0 Total:14     Objective:  Neurological Exam  Physical Exam Physical Examination:   Vitals:   03/29/20 1334  BP: 117/72  Pulse: 82    General Examination: The patient is a very pleasant 44 y.o. female in no acute distress. She appears well-developed and well-nourished and well groomed.   HEENT: Normocephalic, atraumatic, pupils are equal, round and reactive to light, extraocular tracking is good without limitation to gaze excursion or  nystagmus noted. Hearing is grossly intact. Face is symmetric with normal facial animation. Speech is clear with no dysarthria noted. There is no hypophonia. There is no lip, neck/head, jaw or voice tremor. Neck is supple with full range of passive and active motion. There are no carotid bruits on auscultation. Oropharynx exam reveals: mild mouth dryness, good dental hygiene and moderate airway crowding, due to smaller airway entry and redundant soft palate, thicker soft palate noted, uvula not fully visualized, tonsils not fully visualized, Mallampati class III, neck circumference of 16-1/2 inches.  She has no significant overbite.  Tongue protrudes centrally in palate elevates symmetrically.  She has a deviated septum, left nostril more open than right.  Chest: Clear to auscultation without wheezing, rhonchi or crackles noted.  Heart: S1+S2+0, regular and normal without murmurs, rubs or gallops noted.   Abdomen: Soft, non-tender and non-distended with normal bowel sounds appreciated on auscultation.  Extremities: There is no pitting edema in the distal lower extremities bilaterally.   Skin: Warm and dry without trophic changes noted.   Musculoskeletal: exam reveals no obvious joint deformities, tenderness or joint swelling or erythema.   Neurologically:  Mental  status: The patient is awake, alert and oriented in all 4 spheres. Her immediate and remote memory, attention, language skills and fund of knowledge are appropriate. There is no evidence of aphasia, agnosia, apraxia or anomia. Speech is clear with normal prosody and enunciation. Thought process is linear. Mood is normal and affect is normal.  Cranial nerves II - XII are as described above under HEENT exam.  Motor exam: Normal bulk, strength and tone is noted. There is no tremor, fine motor skills and coordination: grossly intact.  Cerebellar testing: No dysmetria or intention tremor. There is no truncal or gait ataxia.  Sensory exam: intact to light touch in the upper and lower extremities.  Gait, station and balance: She stands easily. No veering to one side is noted. No leaning to one side is noted. Posture is age-appropriate and stance is narrow based. Gait shows normal stride length and normal pace. No problems turning are noted.   Assessment and Plan:  In summary, Maili Shutters is a very pleasant 44 y.o.-year old female with an underlying medical history of hyperlipidemia, thyroid disease, low back pain, reflux disease, and morbid obesity with a BMI of over 40, whose history and physical exam are concerning for obstructive sleep apnea (OSA). I had a long chat with the patient about my findings and the diagnosis of OSA, its prognosis and treatment options. We talked about medical treatments, surgical interventions and non-pharmacological approaches. I explained in particular the risks and ramifications of untreated moderate to severe OSA, especially with respect to developing cardiovascular disease down the Road, including congestive heart failure, difficult to treat hypertension, cardiac arrhythmias, or stroke. Even type 2 diabetes has, in part, been linked to untreated OSA. Symptoms of untreated OSA include daytime sleepiness, memory problems, mood irritability and mood disorder such as  depression and anxiety, lack of energy, as well as recurrent headaches, especially morning headaches. We talked about trying to maintain a healthy lifestyle in general, as well as the importance of weight control. We also talked about the importance of good sleep hygiene. I recommended the following at this time: sleep study.  I explained the sleep test procedure to the patient and also outlined possible surgical and non-surgical treatment options of OSA, including the use of a custom-made dental device (which would require a referral to a specialist  dentist or oral surgeon), upper airway surgical options, such as traditional UPPP or a novel less invasive surgical option in the form of Inspire hypoglossal nerve stimulation (which would involve a referral to an ENT surgeon). I also explained the CPAP treatment option to the patient, who indicated that she would be willing to try CPAP if the need arises. I explained the importance of being compliant with PAP treatment, not only for insurance purposes but primarily to improve Her symptoms, and for the patient's long term health benefit, including to reduce Her cardiovascular risks. I answered all her questions today and the patient was in agreement. I plan to see her back after the sleep study is completed and encouraged her to call with any interim questions, concerns, problems or updates.   Thank you very much for allowing me to participate in the care of this nice patient. If I can be of any further assistance to you please do not hesitate to call me at [phone].  Sincerely,   Star Age, MD, PhD

## 2020-04-15 ENCOUNTER — Ambulatory Visit (INDEPENDENT_AMBULATORY_CARE_PROVIDER_SITE_OTHER): Payer: BC Managed Care – PPO | Admitting: Neurology

## 2020-04-15 ENCOUNTER — Other Ambulatory Visit: Payer: Self-pay

## 2020-04-15 DIAGNOSIS — R351 Nocturia: Secondary | ICD-10-CM

## 2020-04-15 DIAGNOSIS — Z6841 Body Mass Index (BMI) 40.0 and over, adult: Secondary | ICD-10-CM

## 2020-04-15 DIAGNOSIS — R0681 Apnea, not elsewhere classified: Secondary | ICD-10-CM

## 2020-04-15 DIAGNOSIS — R519 Headache, unspecified: Secondary | ICD-10-CM

## 2020-04-15 DIAGNOSIS — G4733 Obstructive sleep apnea (adult) (pediatric): Secondary | ICD-10-CM | POA: Diagnosis not present

## 2020-04-15 DIAGNOSIS — G4720 Circadian rhythm sleep disorder, unspecified type: Secondary | ICD-10-CM

## 2020-04-15 DIAGNOSIS — G4719 Other hypersomnia: Secondary | ICD-10-CM

## 2020-04-15 DIAGNOSIS — R0683 Snoring: Secondary | ICD-10-CM

## 2020-04-29 NOTE — Procedures (Signed)
PATIENT'S NAME:  [name], [name] DOB:      [birth year]      MR#:    [id number]     DATE OF RECORDING: 04/15/2020 REFERRING M.D.:  Leanna Battles MD Study Performed:   Baseline Polysomnogram HISTORY: 44 year old woman with a history of hyperlipidemia, thyroid disease, low back pain, reflux disease, and morbid obesity with a BMI of over 40, who reports snoring and nonrestorative sleep, as well as sleep disruption and witnessed apneas, per husband. The patient endorsed the Epworth Sleepiness Scale at 14 points. The patient's weight 272 pounds with a height of 65 (inches), resulting in a BMI of 45.2 kg/m2. The patient's neck circumference measured 16.5 inches.  CURRENT MEDICATIONS: Multivitamin, Allegra-D, Synthroid   PROCEDURE:  This is a multichannel digital polysomnogram utilizing the Somnostar 11.2 system.  Electrodes and sensors were applied and monitored per AASM Specifications.   EEG, EOG, Chin and Limb EMG, were sampled at 200 Hz.  ECG, Snore and Nasal Pressure, Thermal Airflow, Respiratory Effort, CPAP Flow and Pressure, Oximetry was sampled at 50 Hz. Digital video and audio were recorded.      BASELINE STUDY  Lights Out was at 22:42 and Lights On at 04:52.  Total recording time (TRT) was 370.5 minutes, with a total sleep time (TST) of 277 minutes. The patient's sleep latency was 48.5 minutes.  REM latency was 67 minutes.  The sleep efficiency was 74.8 %.     SLEEP ARCHITECTURE: WASO (Wake after sleep onset) was 61.5 minutes with mild to moderate sleep fragmentation noted.  There were 17.5 minutes in Stage N1, 150 minutes Stage N2, 77 minutes Stage N3 and 32.5 minutes in Stage REM.  The percentage of Stage N1 was 6.3%, Stage N2 was 54.2%, Stage N3 was 27.8%, which is increased, and Stage R (REM sleep) was 11.7%, which is reduced. The arousals were noted as: 25 were spontaneous, 0 were associated with PLMs, 32 were associated with respiratory events.  RESPIRATORY ANALYSIS:  There were a  total of 62 respiratory events:  0 obstructive apneas, 0 central apneas and 0 mixed apneas with a total of 0 apneas and an apnea index (AI) of 0 /hour. There were 62 hypopneas with a hypopnea index of 13.4 /hour. The patient also had 0 respiratory event related arousals (RERAs).      The total APNEA/HYPOPNEA INDEX (AHI) was 13.4/hour and the total RESPIRATORY DISTURBANCE INDEX was  13.4 /hour.  22 events occurred in REM sleep and 80 events in NREM. The REM AHI was  40.6 /hour, versus a non-REM AHI of 9.8. The patient spent 23 minutes of total sleep time in the supine position and 254 minutes in non-supine.. The supine AHI was 44.3 versus a non-supine AHI of 10.6.  OXYGEN SATURATION & C02:  The Wake baseline 02 saturation was 96%, with the lowest being 85%. Time spent below 89% saturation equaled 6 minutes.  PERIODIC LIMB MOVEMENTS: The patient had a total of 0 Periodic Limb Movements.  The Periodic Limb Movement (PLM) index was 0 and the PLM Arousal index was 0/hour.  Audio and video analysis did not show any abnormal or unusual movements, behaviors, phonations or vocalizations. The patient took no bathroom breaks. Mild snoring was noted. The EKG was in keeping with normal sinus rhythm (NSR).  Post-study, the patient indicated that sleep was worse than usual.   IMPRESSION:  1. Obstructive Sleep Apnea (OSA) 2. Dysfunctions associated with sleep stages or arousal from sleep  RECOMMENDATIONS:  1. This study demonstrates  overall mild obstructive sleep apnea, severe in REM sleep with a total AHI of 13.4/hour, REM AHI of 40.6/hour, supine AHI of 44.3/hour and O2 nadir of 85%. The absence of supine REM sleep may underestimate her AHI and O2 nadir. Given the patient's medical history and sleep related complaints, treatment with positive airway pressure is recommended; this can be achieved in the form of autoPAP. Alternatively, a full-night CPAP titration study would allow optimization of therapy if  needed. Other treatment options may include avoidance of supine sleep position along with weight loss, upper airway or jaw surgery in selected patients or the use of an oral appliance in certain patients. ENT evaluation and/or consultation with a maxillofacial surgeon or dentist may be feasible in some instances.    2. Please note that untreated obstructive sleep apnea may carry additional perioperative morbidity. Patients with significant obstructive sleep apnea should receive perioperative PAP therapy and the surgeons and particularly the anesthesiologist should be informed of the diagnosis and the severity of the sleep disordered breathing. 3. This study shows sleep fragmentation and abnormal sleep stage percentages; these are nonspecific findings and per se do not signify an intrinsic sleep disorder or a cause for the patient's sleep-related symptoms. Causes include (but are not limited to) the first night effect of the sleep study, circadian rhythm disturbances, medication effect or an underlying mood disorder or medical problem.  4. The patient should be cautioned not to drive, work at heights, or operate dangerous or heavy equipment when tired or sleepy. Review and reiteration of good sleep hygiene measures should be pursued with any patient. 5. The patient will be seen in follow-up by Dr. Rexene Alberts at [facility] for discussion of the test results and further management strategies. The referring provider will be notified of the test results.  I certify that I have reviewed the entire raw data recording prior to the issuance of this report in accordance with the Standards of Accreditation of the American Academy of Sleep Medicine (AASM)  Star Age, MD, PhD Diplomat, American Board of Neurology and Sleep Medicine (Neurology and Sleep Medicine)

## 2020-04-29 NOTE — Progress Notes (Signed)
Patient referred by Dr. Philip Aspen, seen by me on 03/29/20, diagnostic PSG on 04/15/20.    Please call and notify the patient that the recent sleep study did confirm the diagnosis of obstructive sleep apnea. OSA is overall mild, but severe during REM sleep. I recommend treatment in the form of autoPAP, which means, that we don't have to bring her back for a second sleep study with CPAP, but will let her try an autoPAP machine at home, through a DME company (of her choice, or as per insurance requirement). The DME representative will educate her on how to use the machine, how to put the mask on, etc. I have placed an order in the chart. Please send referral, talk to patient, send report to referring MD. We will need a FU in sleep clinic for 10 weeks post-PAP set up, please arrange that with me or one of our NPs. Thanks,   Star Age, MD, PhD [facility])

## 2020-04-29 NOTE — Addendum Note (Signed)
Addended by: Star Age on: 04/29/2020 05:32 PM   Modules accepted: Orders

## 2020-05-03 ENCOUNTER — Telehealth: Payer: Self-pay

## 2020-05-03 NOTE — Telephone Encounter (Signed)
I called pt. I advised pt that Dr. Rexene Alberts reviewed their sleep study results and found that pt mild osa but severe in rem stage of sleep. Dr. Rexene Alberts recommends that pt start an autopap for treatment.. I reviewed PAP compliance expectations with the pt. Pt is agreeable to starting an auto-PAP. I advised pt that an order will be sent to a DME, [facility], and [facility] will call the pt within about one week after they file with the pt's insurance. [facility] will show the pt how to use the machine, fit for masks, and troubleshoot the auto-PAP if needed. A follow up appt was made for insurance purposes with Dr. Rexene Alberts on 07/29/2020 at 1 pm. Pt verbalized understanding to arrive 15 minutes early and bring their auto-PAP. A letter with all of this information in it will be mailed to the pt as a reminder. I verified with the pt that the address we have on file is correct. Pt verbalized understanding of results. Pt had no questions at this time but was encouraged to call back if questions arise. I have sent the order to [facility] and have received confirmation that they have received the order.

## 2020-05-03 NOTE — Telephone Encounter (Signed)
-----   Message from Star Age, MD sent at 04/29/2020  5:32 PM EDT ----- Patient referred by Dr. Philip Aspen, seen by me on 03/29/20, diagnostic PSG on 04/15/20.    Please call and notify the patient that the recent sleep study did confirm the diagnosis of obstructive sleep apnea. OSA is overall mild, but severe during REM sleep. I recommend treatment in the form of autoPAP, which means, that we don't have to bring her back for a second sleep study with CPAP, but will let her try an autoPAP machine at home, through a DME company (of her choice, or as per insurance requirement). The DME representative will educate her on how to use the machine, how to put the mask on, etc. I have placed an order in the chart. Please send referral, talk to patient, send report to referring MD. We will need a FU in sleep clinic for 10 weeks post-PAP set up, please arrange that with me or one of our NPs. Thanks,   Star Age, MD, PhD [facility])

## 2020-05-18 DIAGNOSIS — L814 Other melanin hyperpigmentation: Secondary | ICD-10-CM | POA: Diagnosis not present

## 2020-05-18 DIAGNOSIS — L718 Other rosacea: Secondary | ICD-10-CM | POA: Diagnosis not present

## 2020-05-18 DIAGNOSIS — D485 Neoplasm of uncertain behavior of skin: Secondary | ICD-10-CM | POA: Diagnosis not present

## 2020-05-18 DIAGNOSIS — L918 Other hypertrophic disorders of the skin: Secondary | ICD-10-CM | POA: Diagnosis not present

## 2020-05-18 DIAGNOSIS — D2262 Melanocytic nevi of left upper limb, including shoulder: Secondary | ICD-10-CM | POA: Diagnosis not present

## 2020-05-18 DIAGNOSIS — L919 Hypertrophic disorder of the skin, unspecified: Secondary | ICD-10-CM | POA: Diagnosis not present

## 2020-05-18 DIAGNOSIS — L821 Other seborrheic keratosis: Secondary | ICD-10-CM | POA: Diagnosis not present

## 2020-05-18 DIAGNOSIS — D2372 Other benign neoplasm of skin of left lower limb, including hip: Secondary | ICD-10-CM | POA: Diagnosis not present

## 2020-06-09 DIAGNOSIS — G4733 Obstructive sleep apnea (adult) (pediatric): Secondary | ICD-10-CM | POA: Diagnosis not present

## 2020-07-09 DIAGNOSIS — G4733 Obstructive sleep apnea (adult) (pediatric): Secondary | ICD-10-CM | POA: Diagnosis not present

## 2020-07-29 ENCOUNTER — Encounter: Payer: Self-pay | Admitting: Neurology

## 2020-07-29 ENCOUNTER — Ambulatory Visit: Payer: BC Managed Care – PPO | Admitting: Neurology

## 2020-07-29 ENCOUNTER — Other Ambulatory Visit: Payer: Self-pay

## 2020-07-29 VITALS — BP 119/80 | HR 82 | Ht 65.0 in | Wt 255.0 lb

## 2020-07-29 DIAGNOSIS — G4733 Obstructive sleep apnea (adult) (pediatric): Secondary | ICD-10-CM | POA: Diagnosis not present

## 2020-07-29 DIAGNOSIS — Z9989 Dependence on other enabling machines and devices: Secondary | ICD-10-CM | POA: Diagnosis not present

## 2020-07-29 NOTE — Progress Notes (Signed)
Subjective:    Patient ID: [name] is a 44 y.o. female.  HPI     Interim history:   [name], is a 44 year old right-handed woman with an underlying medical history of hyperlipidemia, thyroid disease, low back pain, reflux disease, and morbid obesity with a BMI of over 40, who Presents for follow-up consultation of her obstructive sleep apnea, after interim sleep testing and starting AutoPap therapy.  The patient is unaccompanied today.  I first met her on 03/29/2020 at the request of her primary care physician, at which time she reported sleep disruption and nonrestorative sleep as well as snoring and witnessed apneas.  She was advised to proceed with sleep study.  She had a baseline sleep study on 04/15/2020 which showed a sleep efficiency of 74.8%, sleep latency delayed at 48.5 minutes and REM latency 67 minutes.  She had a mildly reduced percentage of REM sleep at 11.7%, did not achieve any supine REM sleep.  Her overall AHI was in the mild range at 13.4/h, REM AHI was in the severe range at 40.6/h and supine AHI was also in the severe range at 44.3/h, O2 nadir of 85%.  She was advised to proceed with AutoPap therapy at home.  Her set up date was around 06/08/2020.  Today, 07/29/20: I reviewed her autoPAP compliance data from 06/28/2020 through 07/27/2020, which is a total of 30 days, during which time she used her machine every night with percent used days greater than 4 hours at 100%, indicating superb compliance with an average usage of 7 hours and 22 minutes, residual AHI at goal at 0.8/h, 95th percentile of pressure at 10.2 cm with a range of 6 to 12 cm with EPR of three, leak on the very low side with the 95th percentile at 1.6 L/min.  She reports that in the first 4 days or so her machine was not registering her usage.  She took her machine to the relatives house and it was registering usage there.  She has had some intermittent issues with the Wi-Fi but overall it is  registering now.  She feels improved with regards to her sleep quality, daytime energy, and sleep consolidation.  She is very pleased with how she is doing, she is using a full facemask without significant problems.  She tries to hydrate well.  She is very motivated to continue with treatment.  She has been able to lose weight.  She reports that she typically sleeps on her left side, which she did during the sleep study as well.  Since her back surgery she typically sleeps on the left or right side.  The patient's allergies, current medications, family history, past medical history, past social history, past surgical history and problem list were reviewed and updated as appropriate.   Previously:   03/29/20: (She) reports snoring and nonrestorative sleep, as well as sleep disruption and witnessed apneas, per husband.  I reviewed your office note from 02/27/2020. Her Epworth sleepiness score is 14/24, fatigue severity score is 25/63.  She reports significant stress, she takes care of her elderly parents, her mother has been practically bedridden since November 2020.  Patient had back surgery in December 2020.  She was taking part in a weight loss trial and had been able to lose about 40 pounds but when her back problem started she gained most of it back.  She is back enrolled in a weight loss study.  Her snoring was less when she was able to lose weight.  She is hoping to get back on track with her physical activity.  She goes to bed around midnight and rise time is around 730, or sometimes close to 8.  She is able to work from home, works as a Freight forwarder for [employer].  She lives with her husband and 2 of her 3 sons, oldest 43 is on his own at age 55.  She has no family history of sleep apnea.  She is a non-smoker and drinks alcohol occasionally on the weekends, caffeine in the form of coffee, generally 2 cups in the morning and 1 or 2 soda servings per day on average.  She does go to the bathroom at night,  on average 2-3 times, she has woken up with a headache at times.  They have 2 dogs in the household, 1 sleeps with them in the bedroom.  She has a TV in the bedroom that she watches occasionally, does not have to have it on and there is another TV that her husband uses to play games on.  She is a mouth breather and since her back surgery she sleeps on one side or the other, typically not on her back or her stomach.     Her Past Medical History Is Significant For: Past Medical History:  Diagnosis Date  . Thyroid disease     Her Past Surgical History Is Significant For: Past Surgical History:  Procedure Laterality Date  . BACK SURGERY    . CESAREAN SECTION WITH BILATERAL TUBAL LIGATION    . CHOLECYSTECTOMY    . TUBAL LIGATION    . WISDOM TOOTH EXTRACTION      Her Family History Is Significant For: No family history on file.  Her Social History Is Significant For: Social History   Socioeconomic History  . Marital status: Married    Spouse name: Not on file  . Number of children: Not on file  . Years of education: Not on file  . Highest education level: Not on file  Occupational History  . Not on file  Tobacco Use  . Smoking status: Never Smoker  . Smokeless tobacco: Never Used  Substance and Sexual Activity  . Alcohol use: Yes    Comment: 0-2 week  . Drug use: Never  . Sexual activity: Not on file  Other Topics Concern  . Not on file  Social History Narrative  . Not on file   Social Determinants of Health   Financial Resource Strain:   . Difficulty of Paying Living Expenses: Not on file  Food Insecurity:   . Worried About Charity fundraiser in the Last Year: Not on file  . Ran Out of Food in the Last Year: Not on file  Transportation Needs:   . Lack of Transportation (Medical): Not on file  . Lack of Transportation (Non-Medical): Not on file  Physical Activity:   . Days of Exercise per Week: Not on file  . Minutes of Exercise per Session: Not on file  Stress:    . Feeling of Stress : Not on file  Social Connections:   . Frequency of Communication with Friends and Family: Not on file  . Frequency of Social Gatherings with Friends and Family: Not on file  . Attends Religious Services: Not on file  . Active Member of Clubs or Organizations: Not on file  . Attends Archivist Meetings: Not on file  . Marital Status: Not on file    Her Allergies Are:  No Known  Allergies:   Her Current Medications Are:  Outpatient Encounter Medications as of 07/29/2020  Medication Sig  . Biotin w/ Vitamins C & E (HAIR SKIN & NAILS GUMMIES PO) Take by mouth.  . Fexofenadine-Pseudoephedrine (ALLEGRA-D 24 HOUR PO) Take by mouth.  . levothyroxine (SYNTHROID) 25 MCG tablet Take 25 mcg by mouth daily before breakfast.  . Multiple Vitamin (MULTI-VITAMIN PO) Take by mouth.  Marland Kitchen UNABLE TO FIND Med Name: Tirzepatide clinical study   No facility-administered encounter medications on file as of 07/29/2020.  :  Review of Systems:  Out of a complete 14 point review of systems, all are reviewed and negative with the exception of these symptoms as listed below: Review of Systems  Neurological:       Pt here today for cpap fu, she states cpap is working well for her, sleep has improved.  Epworth Sleepiness Scale 0= would never doze 1= slight chance of dozing 2= moderate chance of dozing 3= high chance of dozing  Sitting and reading:1 Watching TV:0 Sitting inactive in a public place (ex. Theater or meeting):1 As a passenger in a car for an hour without a break:1 Lying down to rest in the afternoon:2 Sitting and talking to someone:0 Sitting quietly after lunch (no alcohol):1 In a car, while stopped in traffic:0 Total:6    Objective:  Neurological Exam  Physical Exam Physical Examination:   Vitals:   07/29/20 1258  BP: 119/80  Pulse: 82    General Examination: The patient is a very pleasant 44 y.o. female in no acute distress. She appears  well-developed and well-nourished and well groomed.   HEENT: Normocephalic, atraumatic, pupils are equal, round and reactive to light, extraocular tracking is good without limitation to gaze excursion or nystagmus noted. Hearing is grossly intact. Face is symmetric with normal facial animation. Speech is clear with no dysarthria noted. There is no hypophonia. There is no lip, neck/head, jaw or voice tremor. Neck is supple with full range of passive and active motion. There are no carotid bruits on auscultation. Oropharynx exam reveals: mild mouth dryness, good dental hygiene and moderate airway crowding.  Tongue protrudes centrally in palate elevates symmetrically.    Chest: Clear to auscultation without wheezing, rhonchi or crackles noted.  Heart: S1+S2+0, regular and normal without murmurs, rubs or gallops noted.   Abdomen: Soft, non-tender and non-distended with normal bowel sounds appreciated on auscultation.  Extremities: There is no obvious edema in the distal lower extremities bilaterally.   Skin: Warm and dry without trophic changes noted.   Musculoskeletal: exam reveals no obvious joint deformities, tenderness or joint swelling or erythema.   Neurologically:  Mental status: The patient is awake, alert and oriented in all 4 spheres. Her immediate and remote memory, attention, language skills and fund of knowledge are appropriate. There is no evidence of aphasia, agnosia, apraxia or anomia. Speech is clear with normal prosody and enunciation. Thought process is linear. Mood is normal and affect is normal.  Cranial nerves II - XII are as described above under HEENT exam.  Motor exam: Normal bulk, strength and tone is noted. There is no tremor, fine motor skills and coordination: grossly intact.  Cerebellar testing: No dysmetria or intention tremor. There is no truncal or gait ataxia.  Sensory exam: intact to light touch in the upper and lower extremities.  Gait, station and  balance: She stands easily. No veering to one side is noted. No leaning to one side is noted. Posture is age-appropriate and stance is narrow  based. Gait shows normal stride length and normal pace. No problems turning are noted.   Assessment and Plan:  In summary, [name] is a very pleasant 44 year old female with an underlying medical history of hyperlipidemia, thyroid disease, low back pain, reflux disease, and morbid obesity with a BMI of over 40, who presents for follow-up consultation of her obstructive sleep apnea, which was deemed to be in the mild range overall by baseline sleep testing on 04/15/2020.  She did have more significant findings during REM sleep and supine sleep.  She did not achieve any supine REM sleep.  She has established treatment on AutoPap therapy since September 2021 and is fully compliant with treatment.  She has benefited from treatment and indicates very good results.  She is highly commended for her treatment adherence.  She is motivated to continue with treatment and also to work on weight loss.  She has already lost nearly 20 pounds.  She is advised to follow-up in 1 year to see one of our nurse practitioners routinely.  I answered all her questions today and she was in agreement.  We reviewed her sleep study results in detail as well as her compliance data.  I spent 30 minutes in total face-to-face time and in reviewing records during pre-charting, more than 50% of which was spent in counseling and coordination of care, reviewing test results, reviewing medications and treatment regimen and/or in discussing or reviewing the diagnosis of OSA, the prognosis and treatment options. Pertinent laboratory and imaging test results that were available during this visit with the patient were reviewed by me and considered in my medical decision making (see chart for details).

## 2020-07-29 NOTE — Patient Instructions (Signed)
It was good to see you again today.  You have done a great job using your AutoPap machine.  I am very glad to hear that you are also feeling better.  Please continue to use your machine consistently.   Please continue using your autoPAP regularly. While your insurance requires that you use PAP at least 4 hours each night on 70% of the nights, I recommend, that you not skip any nights and use it throughout the night if you can. Getting used to PAP and staying with the treatment long term does take time and patience and discipline. Untreated obstructive sleep apnea when it is moderate to severe can have an adverse impact on cardiovascular health and raise her risk for heart disease, arrhythmias, hypertension, congestive heart failure, stroke and diabetes. Untreated obstructive sleep apnea causes sleep disruption, nonrestorative sleep, and sleep deprivation. This can have an impact on your day to day functioning and cause daytime sleepiness and impairment of cognitive function, memory loss, mood disturbance, and problems focussing. Using PAP regularly can improve these symptoms. Please follow-up in 1 year to see one of our nurse practitioners.  If you have any interim questions or concerns you can always email [place] or call us.  If you would like for [place] to pull another compliance data download we can do this remotely also anytime you like for a recheck on your numbers.

## 2020-08-09 DIAGNOSIS — G4733 Obstructive sleep apnea (adult) (pediatric): Secondary | ICD-10-CM | POA: Diagnosis not present

## 2020-09-08 DIAGNOSIS — G4733 Obstructive sleep apnea (adult) (pediatric): Secondary | ICD-10-CM | POA: Diagnosis not present

## 2020-10-09 DIAGNOSIS — G4733 Obstructive sleep apnea (adult) (pediatric): Secondary | ICD-10-CM | POA: Diagnosis not present

## 2020-11-09 DIAGNOSIS — G4733 Obstructive sleep apnea (adult) (pediatric): Secondary | ICD-10-CM | POA: Diagnosis not present

## 2020-12-07 DIAGNOSIS — G4733 Obstructive sleep apnea (adult) (pediatric): Secondary | ICD-10-CM | POA: Diagnosis not present

## 2020-12-21 ENCOUNTER — Telehealth: Payer: Self-pay | Admitting: Neurology

## 2020-12-21 NOTE — Telephone Encounter (Signed)
Per RN's request, this was all of the information that Dominica from [facility] had given phone rep.

## 2020-12-21 NOTE — Telephone Encounter (Signed)
[name] from [place] is asking if we requested CPAP being in for out for pt.  Best contact: [phone] ext.   [phone]

## 2020-12-22 NOTE — Telephone Encounter (Signed)
Lorriane Shire called me back, and we discussed message.  She states they received the claim for patients CPAP along with order for exchanging a out of network equipment company as an in [employer].  She wanted to know if we requested this change, I advised we did not.  She states that likely came from the equipment company itself and just wanted to make sure he had no further information.

## 2020-12-22 NOTE — Telephone Encounter (Signed)
I called [name] back and left a vm asking for a call to discuss this message.

## 2021-01-07 DIAGNOSIS — G4733 Obstructive sleep apnea (adult) (pediatric): Secondary | ICD-10-CM | POA: Diagnosis not present

## 2021-02-06 DIAGNOSIS — G4733 Obstructive sleep apnea (adult) (pediatric): Secondary | ICD-10-CM | POA: Diagnosis not present

## 2021-03-07 DIAGNOSIS — Z1212 Encounter for screening for malignant neoplasm of rectum: Secondary | ICD-10-CM | POA: Diagnosis not present

## 2021-04-18 ENCOUNTER — Encounter: Payer: Self-pay | Admitting: Gastroenterology

## 2021-05-06 IMAGING — CR DG CHEST 2V
2 series · 2 of 2 positions shown · non-contrast
Comparison: Left rib radiograph dated 06/21/2012

CLINICAL DATA: 42-year-old female with shortness of breath and
chest pain.

EXAM:
CHEST - 2 VIEW

[chest pa]
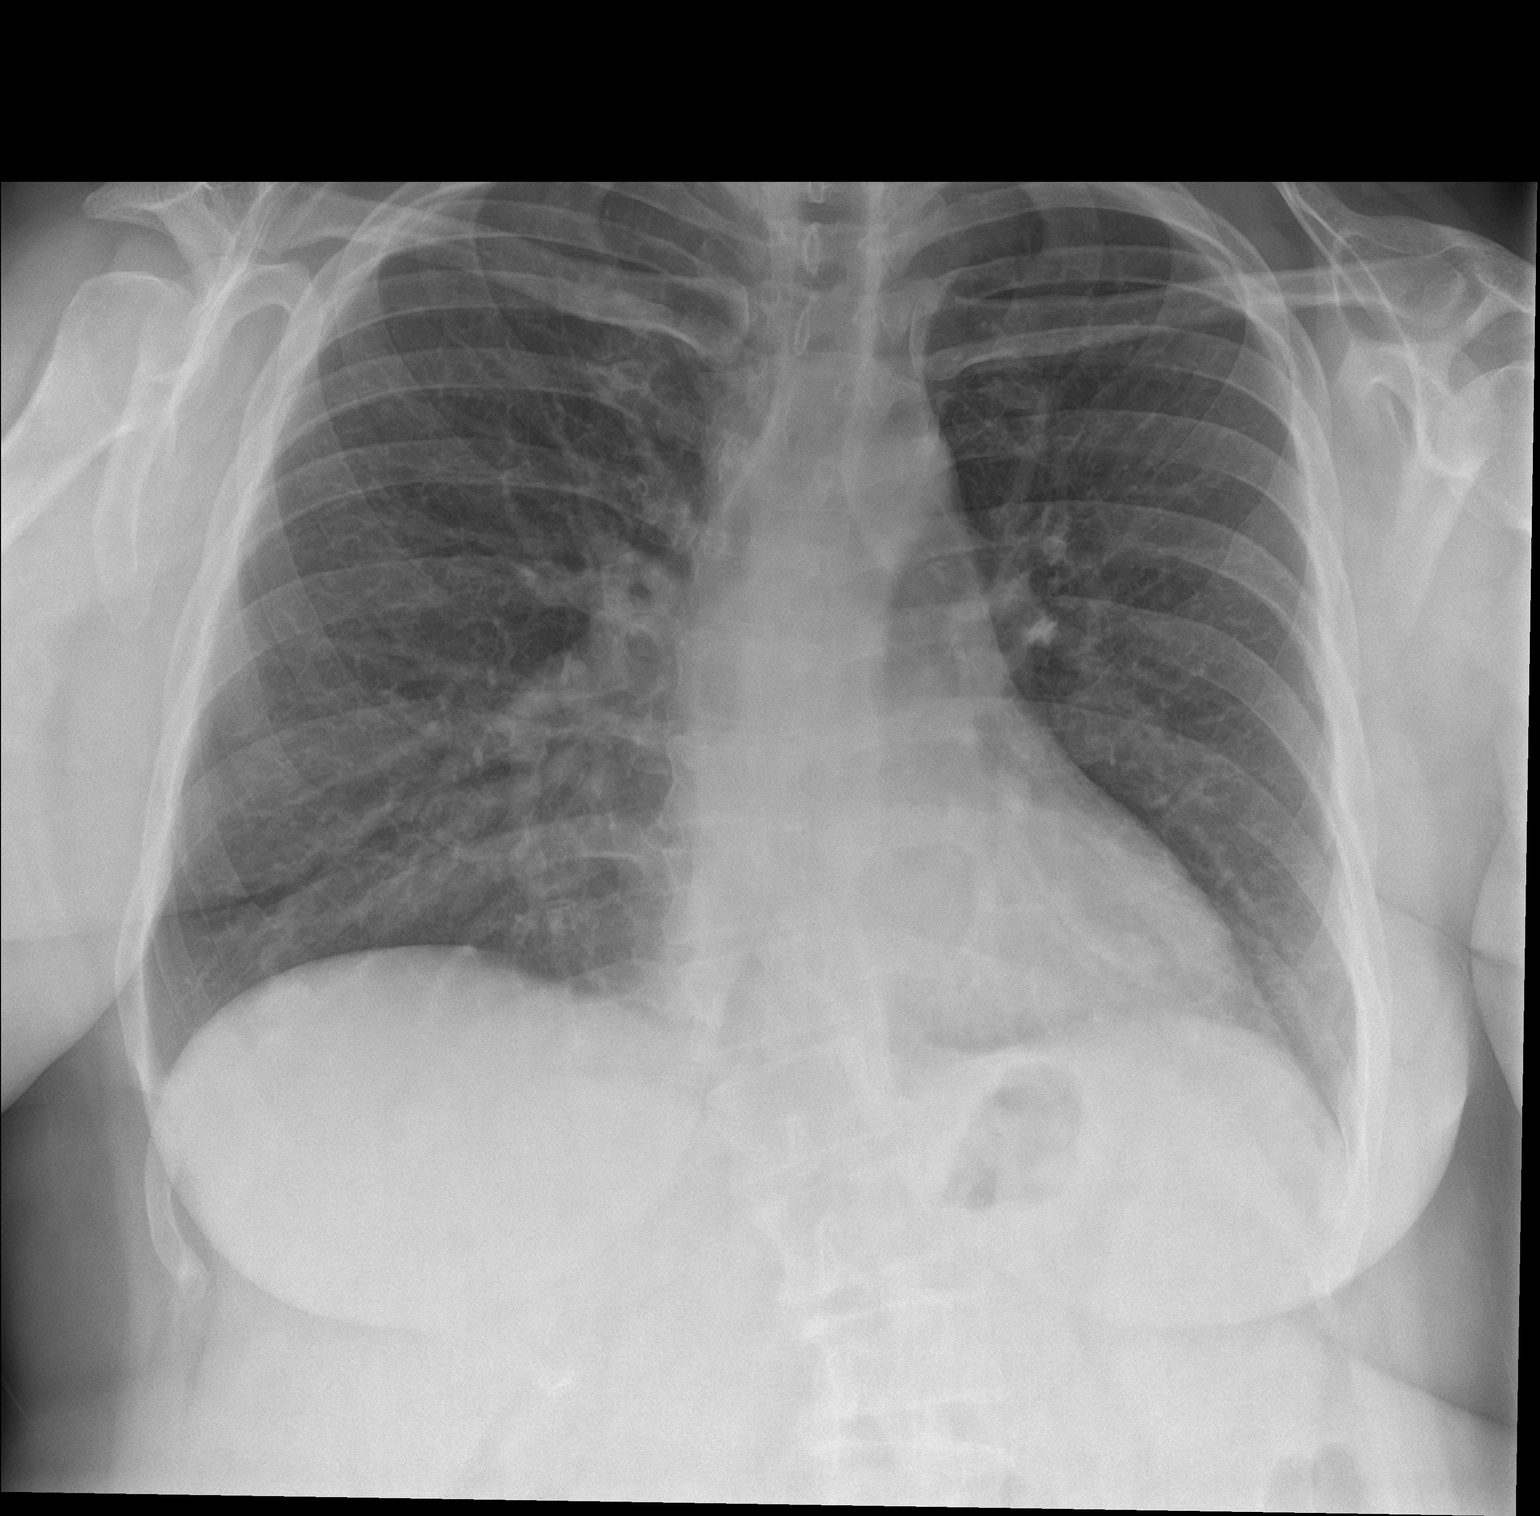

[chest lat]
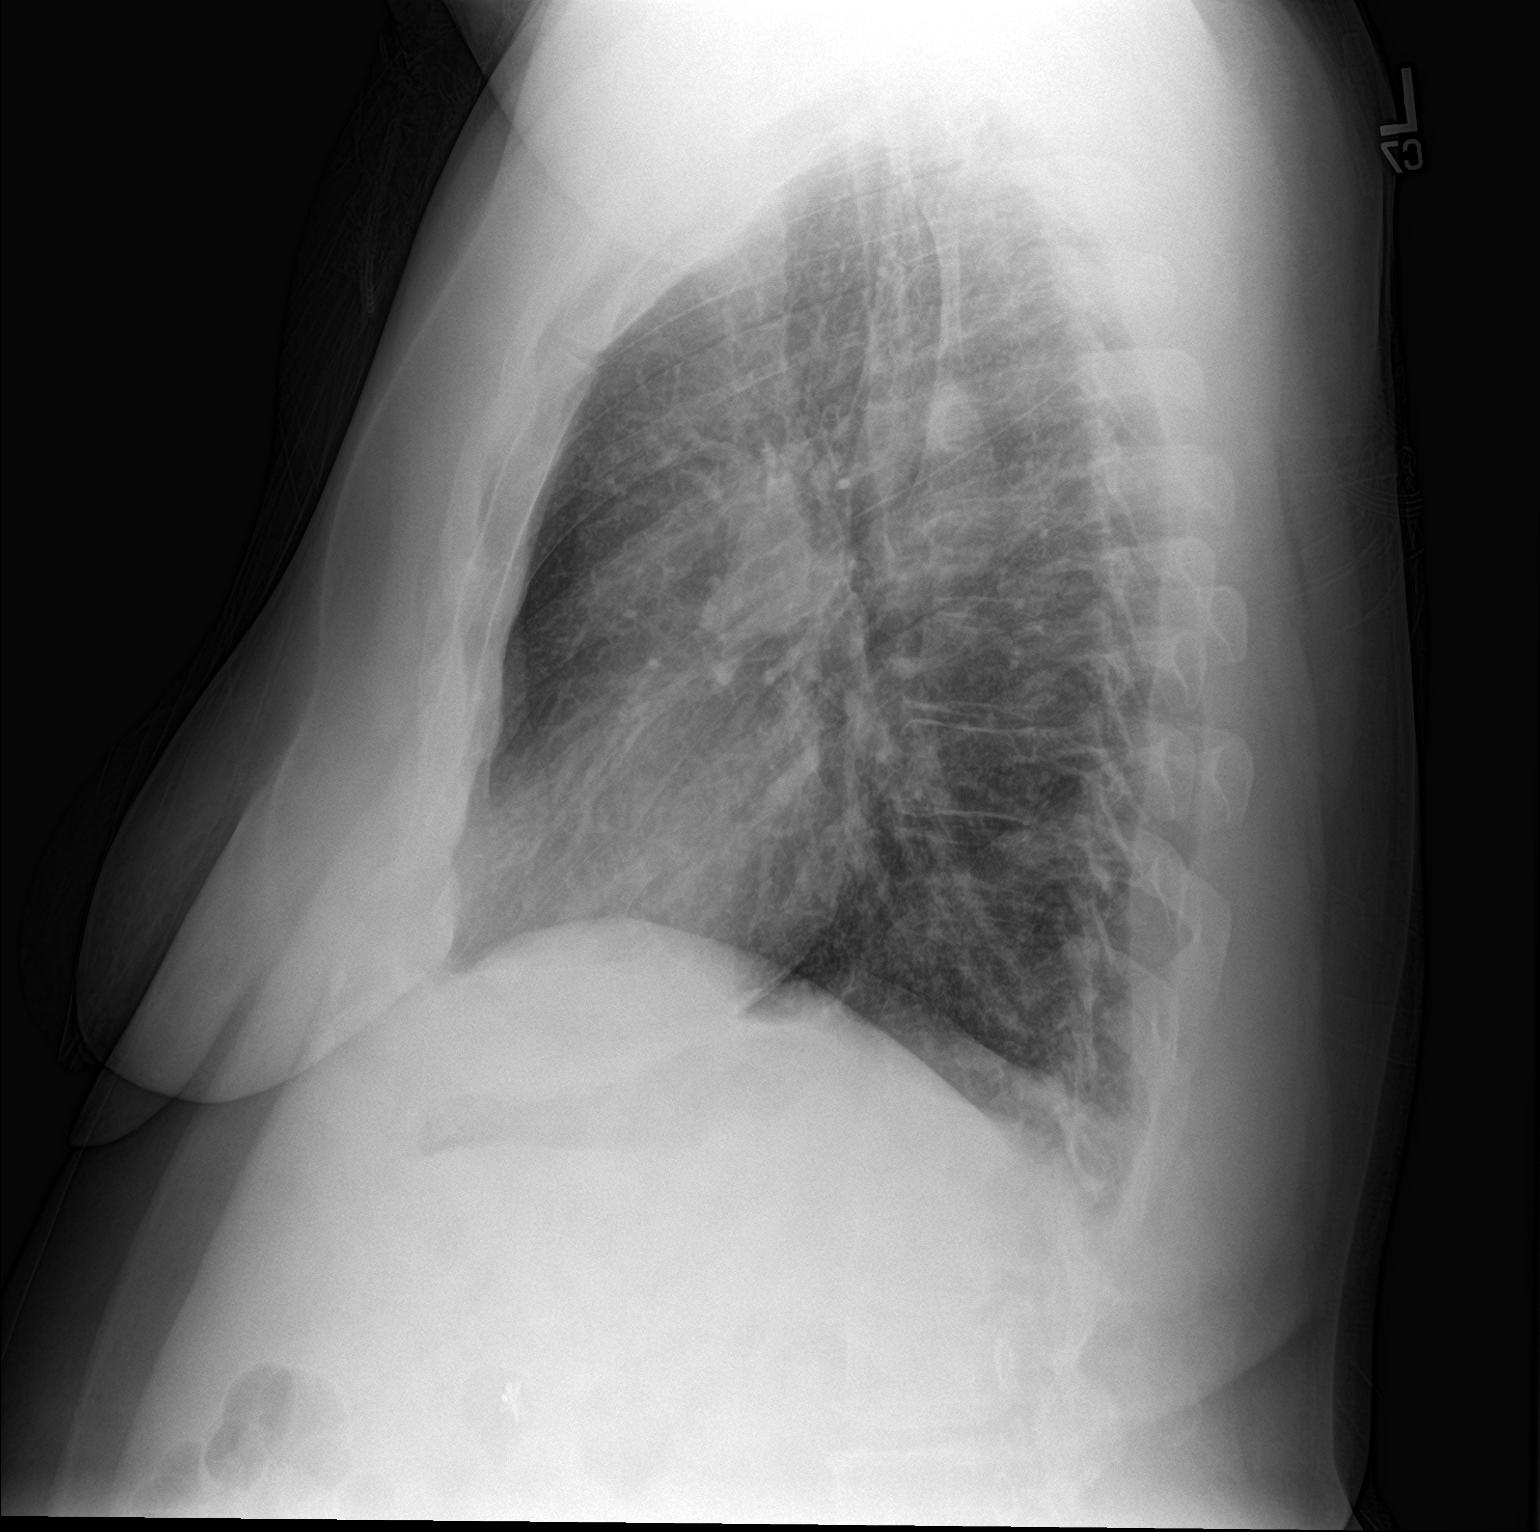

[2 of 2 positions shown; findings below may reference images not displayed]

FINDINGS: No focal consolidation, pleural effusion, or pneumothorax. The
cardiac silhouette is within normal limits. No acute osseous
pathology. Scoliosis.
IMPRESSION: No active cardiopulmonary disease.

## 2021-05-11 ENCOUNTER — Other Ambulatory Visit: Payer: Self-pay

## 2021-05-11 ENCOUNTER — Ambulatory Visit (AMBULATORY_SURGERY_CENTER): Payer: BC Managed Care – PPO

## 2021-05-11 VITALS — Ht 65.0 in | Wt 264.0 lb

## 2021-05-11 DIAGNOSIS — Z1211 Encounter for screening for malignant neoplasm of colon: Secondary | ICD-10-CM

## 2021-05-11 MED ORDER — PLENVU 140 G PO SOLR: 1.0000 | ORAL | 0 refills | Status: DC

## 2021-05-11 NOTE — Progress Notes (Signed)
Patient's pre-visit was done today over the phone with the patient  Name,DOB and address verified.  Patient denies any allergies to Eggs and Soy. Patient denies any problems with anesthesia/sedation. Patient denies taking diet pills or blood thinners. No home Oxygen. Packet of Prep instructions mailed to patient including a copy of a consent form-pt is aware. Patient understands to call us back with any questions or concerns. Patient is aware of our care-partner policy and 0000000 safety protocol.

## 2021-05-19 DIAGNOSIS — L814 Other melanin hyperpigmentation: Secondary | ICD-10-CM | POA: Diagnosis not present

## 2021-05-19 DIAGNOSIS — D1801 Hemangioma of skin and subcutaneous tissue: Secondary | ICD-10-CM | POA: Diagnosis not present

## 2021-05-19 DIAGNOSIS — L821 Other seborrheic keratosis: Secondary | ICD-10-CM | POA: Diagnosis not present

## 2021-05-19 DIAGNOSIS — L570 Actinic keratosis: Secondary | ICD-10-CM | POA: Diagnosis not present

## 2021-05-24 ENCOUNTER — Telehealth: Payer: Self-pay | Admitting: Gastroenterology

## 2021-05-24 ENCOUNTER — Encounter: Payer: Self-pay | Admitting: Gastroenterology

## 2021-05-24 NOTE — Telephone Encounter (Signed)
Patient's prep instruction sent to her email, [facility] coupon called to her pharmacy-pt is aware.

## 2021-05-24 NOTE — Telephone Encounter (Signed)
Pt states that she has not received colon instructions yet. Her proc is on 05/27/21. Pt will activate Mychart account to be able to access it. Pt still needs discount coupon for prep. Pls assist her with that.

## 2021-05-27 ENCOUNTER — Ambulatory Visit (AMBULATORY_SURGERY_CENTER): Payer: BC Managed Care – PPO | Admitting: Gastroenterology

## 2021-05-27 ENCOUNTER — Other Ambulatory Visit: Payer: Self-pay

## 2021-05-27 ENCOUNTER — Encounter: Payer: Self-pay | Admitting: Gastroenterology

## 2021-05-27 VITALS — BP 109/66 | HR 51 | Temp 98.2°F | Resp 9 | Ht 65.0 in | Wt 264.0 lb

## 2021-05-27 DIAGNOSIS — D123 Benign neoplasm of transverse colon: Secondary | ICD-10-CM | POA: Diagnosis not present

## 2021-05-27 DIAGNOSIS — Z1211 Encounter for screening for malignant neoplasm of colon: Secondary | ICD-10-CM

## 2021-05-27 DIAGNOSIS — D125 Benign neoplasm of sigmoid colon: Secondary | ICD-10-CM | POA: Diagnosis not present

## 2021-05-27 MED ORDER — SODIUM CHLORIDE 0.9 % IV SOLN: 500.0000 mL | Freq: Once | INTRAVENOUS | Status: DC

## 2021-05-27 NOTE — Progress Notes (Signed)
VS- [name]  Pt's states no medical or surgical changes since previsit or office visit.  IV site bruised at insertion but flows freely without any additional swelling noted. No pain at IV site per patient

## 2021-05-27 NOTE — Progress Notes (Signed)
Called to room to assist during endoscopic procedure.  Patient ID and intended procedure confirmed with present staff. Received instructions for my participation in the procedure from the performing physician.  

## 2021-05-27 NOTE — Progress Notes (Signed)
History & Physical  Primary Care Physician:  Leanna Battles, MD Primary Gastroenterologist: Jerilynn Mages. Fuller Plan, MD  CHIEF COMPLAINT:  CRC screening   HPI: [name] is a 45 y.o. female who presents for colonoscopy for average risk colorectal cancer screening.  No active GI complaints.  This is her first colonoscopy.   Past Medical History:  Diagnosis Date   Sleep apnea    using a device   Thyroid disease     Past Surgical History:  Procedure Laterality Date   BACK SURGERY     CESAREAN SECTION WITH BILATERAL TUBAL LIGATION     CHOLECYSTECTOMY     TUBAL LIGATION     WISDOM TOOTH EXTRACTION      Prior to Admission medications   Medication Sig Start Date End Date Taking? Authorizing Provider  levothyroxine (SYNTHROID) 25 MCG tablet Take 25 mcg by mouth daily before breakfast.   Yes [provider]  UNABLE TO FIND Med Name: Tirzepatide clinical study   Yes [provider]  Biotin w/ Vitamins C & E (HAIR SKIN & NAILS GUMMIES PO) Take by mouth. Patient not taking: No sig reported    [provider]  Fexofenadine-Pseudoephedrine (ALLEGRA-D 24 HOUR PO) Take by mouth. Patient not taking: No sig reported    [provider]  Multiple Vitamin (MULTI-VITAMIN PO) Take by mouth. Patient not taking: No sig reported    [provider]    Current Outpatient Medications  Medication Sig Dispense Refill   levothyroxine (SYNTHROID) 25 MCG tablet Take 25 mcg by mouth daily before breakfast.     UNABLE TO FIND Med Name: Tirzepatide clinical study     Biotin w/ Vitamins C & E (HAIR SKIN & NAILS GUMMIES PO) Take by mouth. (Patient not taking: No sig reported)     Fexofenadine-Pseudoephedrine (ALLEGRA-D 24 HOUR PO) Take by mouth. (Patient not taking: No sig reported)     Multiple Vitamin (MULTI-VITAMIN PO) Take by mouth. (Patient not taking: No sig reported)     Current Facility-Administered Medications  Medication Dose Route Frequency  Provider Last Rate Last Admin   0.9 %  sodium chloride infusion  500 mL Intravenous Once Ladene Artist, MD        Allergies as of 05/27/2021   (No Known Allergies)    Family History  Problem Relation Age of Onset   Colon cancer Maternal Aunt    Colon polyps Neg Hx    Esophageal cancer Neg Hx    Stomach cancer Neg Hx    Rectal cancer Neg Hx     Social History   Socioeconomic History   Marital status: Married    Spouse name: Not on file   Number of children: Not on file   Years of education: Not on file   Highest education level: Not on file  Occupational History   Not on file  Tobacco Use   Smoking status: Never   Smokeless tobacco: Never  Vaping Use   Vaping Use: Never used  Substance and Sexual Activity   Alcohol use: Yes    Comment: 0-2 week   Drug use: Never   Sexual activity: Not on file  Other Topics Concern   Not on file  Social History Narrative   Not on file   Social Determinants of Health   Financial Resource Strain: Not on file  Food Insecurity: Not on file  Transportation Needs: Not on file  Physical Activity: Not on file  Stress: Not on file  Social Connections: Not on file  Intimate Partner Violence: Not on file    Review of Systems:  All systems reviewed an negative except where noted in HPI.  Gen: Denies any fever, chills, sweats, anorexia, fatigue, weakness, malaise, weight loss, and sleep disorder CV: Denies chest pain, angina, palpitations, syncope, orthopnea, PND, peripheral edema, and claudication. Resp: Denies dyspnea at rest, dyspnea with exercise, cough, sputum, wheezing, coughing up blood, and pleurisy. GI: Denies vomiting blood, jaundice, and fecal incontinence.   Denies dysphagia or odynophagia. GU : Denies urinary burning, blood in urine, urinary frequency, urinary hesitancy, nocturnal urination, and urinary incontinence. MS: Denies joint pain, limitation of movement, and swelling, stiffness, low back pain, extremity pain.  Denies muscle weakness, cramps, atrophy.  Derm: Denies rash, itching, dry skin, hives, moles, warts, or unhealing ulcers.  Psych: Denies depression, anxiety, memory loss, suicidal ideation, hallucinations, paranoia, and confusion. Heme: Denies bruising, bleeding, and enlarged lymph nodes. Neuro:  Denies any headaches, dizziness, paresthesias. Endo:  Denies any problems with DM, thyroid, adrenal function.   Physical Exam: General:  Alert, well-developed, in NAD Head:  Normocephalic and atraumatic. Eyes:  Sclera clear, no icterus.   Conjunctiva pink. Ears:  Normal auditory acuity. Mouth:  No deformity or lesions.  Neck:  Supple; no masses . Lungs:  Clear throughout to auscultation.   No wheezes, crackles, or rhonchi. No acute distress. Heart:  Regular rate and rhythm; no murmurs. Abdomen:  Soft, nondistended, nontender. No masses, hepatomegaly. No obvious masses.  Normal bowel .    Rectal:  Deferred   Msk:  Symmetrical without gross deformities.. Pulses:  Normal pulses noted. Extremities:  Without edema. Neurologic:  Alert and  oriented x4;  grossly normal neurologically. Skin:  Intact without significant lesions or rashes. Cervical Nodes:  No significant cervical adenopathy. Psych:  Alert and cooperative. Normal mood and affect.   Impression / Plan:   CRC screening, average risk.  This is her first colonoscopy.    This patient is appropriate for endoscopic procedures in the ambulatory setting.    Pricilla Riffle. Fuller Plan  05/27/2021, 8:44 AM

## 2021-05-27 NOTE — Progress Notes (Signed)
PT taken to PACU. Monitors in place. VSS. Report given to RN. 

## 2021-05-27 NOTE — Op Note (Signed)
[facility] Patient Name: [name] Procedure Date: 05/27/2021 8:35 AM MRN: [id number] Endoscopist: Ladene Artist , MD Age: 45 Referring MD:  Date of Birth: October 25, 1975 Gender: Female Account #: [id number] Procedure:                Colonoscopy Indications:              Screening for colorectal malignant neoplasm Medicines:                Monitored Anesthesia Care Procedure:                Pre-Anesthesia Assessment:                           - Prior to the procedure, a History and Physical                            was performed, and patient medications and                            allergies were reviewed. The patient's tolerance of                            previous anesthesia was also reviewed. The risks                            and benefits of the procedure and the sedation                            options and risks were discussed with the patient.                            All questions were answered, and informed consent                            was obtained. Prior Anticoagulants: The patient has                            taken no previous anticoagulant or antiplatelet                            agents. ASA Grade Assessment: III - A patient with                            severe systemic disease. After reviewing the risks                            and benefits, the patient was deemed in                            satisfactory condition to undergo the procedure.                           After obtaining informed consent, the colonoscope  was passed under direct vision. Throughout the                            procedure, the patient's blood pressure, pulse, and                            oxygen saturations were monitored continuously. The                            CF [id number] #[id number] was introduced through the anus                            and advanced to the the cecum, identified by                             appendiceal orifice and ileocecal valve. The                            colonoscopy was performed without difficulty. The                            patient tolerated the procedure well. The quality                            of the bowel preparation was good. Scope In: 8:47:36 AM Scope Out: 9:01:37 AM Scope Withdrawal Time: 0 hours 12 minutes 32 seconds  Total Procedure Duration: 0 hours 14 minutes 1 second  Findings:                 The perianal and digital rectal examinations were                            normal.                           Three sessile polyps were found in the sigmoid                            colon (1) and transverse colon (2). The polyps were                            5 to 7 mm in size. These polyps were removed with a                            cold snare. Resection and retrieval were complete.                           The exam was otherwise normal throughout the                            examined colon. Complications:            No immediate complications. Estimated Blood Loss:     Estimated blood loss: none. Impression:               -  Three 5 to 7 mm polyps in the sigmoid colon and                            in the transverse colon, removed with a cold snare.                            Resected and retrieved. Recommendation:           - The patient will be observed post-procedure,                            until all discharge criteria are met.                           - Patient has a contact number available for                            emergencies. The signs and symptoms of potential                            delayed complications were discussed with the                            patient. Return to normal activities tomorrow.                            Written discharge instructions were provided to the                            patient.                           - Resume previous diet.                           - Continue present medications.                            - Await pathology results.                           - Repeat colonoscopy after studies are complete for                            surveillance based on pathology results. Ladene Artist, MD 05/27/2021 9:13:45 AM This report has been signed electronically.

## 2021-05-27 NOTE — Patient Instructions (Signed)
Information on polyps given to you today.  Await pathology results.  Resume previous diet and medications.  YOU HAD AN ENDOSCOPIC PROCEDURE TODAY AT THE [facility]:   Refer to the procedure report that was given to you for any specific questions about what was found during the examination.  If the procedure report does not answer your questions, please call your gastroenterologist to clarify.  If you requested that your care partner not be given the details of your procedure findings, then the procedure report has been included in a sealed envelope for you to review at your convenience later.  YOU SHOULD EXPECT: Some feelings of bloating in the abdomen. Passage of more gas than usual.  Walking can help get rid of the air that was put into your GI tract during the procedure and reduce the bloating. If you had a lower endoscopy (such as a colonoscopy or flexible sigmoidoscopy) you may notice spotting of blood in your stool or on the toilet paper. If you underwent a bowel prep for your procedure, you may not have a normal bowel movement for a few days.  Please Note:  You might notice some irritation and congestion in your nose or some drainage.  This is from the oxygen used during your procedure.  There is no need for concern and it should clear up in a day or so.  SYMPTOMS TO REPORT IMMEDIATELY:   Following lower endoscopy (colonoscopy or flexible sigmoidoscopy):  Excessive amounts of blood in the stool  Significant tenderness or worsening of abdominal pains  Swelling of the abdomen that is new, acute  Fever of 100F or higher   For urgent or emergent issues, a gastroenterologist can be reached at any hour by calling [phone]. Do not use MyChart messaging for urgent concerns.    DIET:  We do recommend a small meal at first, but then you may proceed to your regular diet.  Drink plenty of fluids but you should avoid alcoholic beverages for 24 hours.  ACTIVITY:  You should  plan to take it easy for the rest of today and you should NOT DRIVE or use heavy machinery until tomorrow (because of the sedation medicines used during the test).    FOLLOW UP: Our staff will call the number listed on your records 48-72 hours following your procedure to check on you and address any questions or concerns that you may have regarding the information given to you following your procedure. If we do not reach you, we will leave a message.  We will attempt to reach you two times.  During this call, we will ask if you have developed any symptoms of COVID 19. If you develop any symptoms (ie: fever, flu-like symptoms, shortness of breath, cough etc.) before then, please call [phone].  If you test positive for Covid 19 in the 2 weeks post procedure, please call and report this information to us.    If any biopsies were taken you will be contacted by phone or by letter within the next 1-3 weeks.  Please call us at [phone] if you have not heard about the biopsies in 3 weeks.    SIGNATURES/CONFIDENTIALITY: You and/or your care partner have signed paperwork which will be entered into your electronic medical record.  These signatures attest to the fact that that the information above on your After Visit Summary has been reviewed and is understood.  Full responsibility of the confidentiality of this discharge information lies with you and/or your care-partner. 

## 2021-05-31 ENCOUNTER — Telehealth: Payer: Self-pay | Admitting: *Deleted

## 2021-05-31 NOTE — Telephone Encounter (Signed)
  Follow up Call-  Call back number 05/27/2021  Post procedure Call Back phone  # [phone]  Permission to leave phone message Yes  Some recent data might be hidden     Patient questions:  Do you have a fever, pain , or abdominal swelling? No. Pain Score  0 *  Have you tolerated food without any problems? Yes.    Have you been able to return to your normal activities? Yes.    Do you have any questions about your discharge instructions: Diet   No. Medications  No. Follow up visit  No.  Do you have questions or concerns about your Care? No.  Actions: * If pain score is 4 or above: No action needed, pain <4.  Have you developed a fever since your procedure? no  2.   Have you had an respiratory symptoms (SOB or cough) since your procedure? no  3.   Have you tested positive for COVID 19 since your procedure no  4.   Have you had any family members/close contacts diagnosed with the COVID 19 since your procedure?  no   If yes to any of these questions please route to Joylene John, RN and Joella Prince, RN

## 2021-05-31 NOTE — Telephone Encounter (Signed)
No answer for post procedure call back. Left VM. 

## 2021-06-10 ENCOUNTER — Encounter: Payer: Self-pay | Admitting: Gastroenterology

## 2021-07-05 DIAGNOSIS — M4726 Other spondylosis with radiculopathy, lumbar region: Secondary | ICD-10-CM | POA: Diagnosis not present

## 2021-07-05 DIAGNOSIS — M5451 Vertebrogenic low back pain: Secondary | ICD-10-CM | POA: Diagnosis not present

## 2021-07-05 DIAGNOSIS — M5440 Lumbago with sciatica, unspecified side: Secondary | ICD-10-CM | POA: Diagnosis not present

## 2021-07-05 DIAGNOSIS — M9903 Segmental and somatic dysfunction of lumbar region: Secondary | ICD-10-CM | POA: Diagnosis not present

## 2021-07-07 DIAGNOSIS — M5440 Lumbago with sciatica, unspecified side: Secondary | ICD-10-CM | POA: Diagnosis not present

## 2021-07-07 DIAGNOSIS — M9903 Segmental and somatic dysfunction of lumbar region: Secondary | ICD-10-CM | POA: Diagnosis not present

## 2021-07-07 DIAGNOSIS — M4726 Other spondylosis with radiculopathy, lumbar region: Secondary | ICD-10-CM | POA: Diagnosis not present

## 2021-07-07 DIAGNOSIS — M5451 Vertebrogenic low back pain: Secondary | ICD-10-CM | POA: Diagnosis not present

## 2021-07-08 DIAGNOSIS — M9903 Segmental and somatic dysfunction of lumbar region: Secondary | ICD-10-CM | POA: Diagnosis not present

## 2021-07-08 DIAGNOSIS — M5451 Vertebrogenic low back pain: Secondary | ICD-10-CM | POA: Diagnosis not present

## 2021-07-08 DIAGNOSIS — M4726 Other spondylosis with radiculopathy, lumbar region: Secondary | ICD-10-CM | POA: Diagnosis not present

## 2021-07-08 DIAGNOSIS — M5440 Lumbago with sciatica, unspecified side: Secondary | ICD-10-CM | POA: Diagnosis not present

## 2021-07-12 DIAGNOSIS — M5451 Vertebrogenic low back pain: Secondary | ICD-10-CM | POA: Diagnosis not present

## 2021-07-12 DIAGNOSIS — M5440 Lumbago with sciatica, unspecified side: Secondary | ICD-10-CM | POA: Diagnosis not present

## 2021-07-12 DIAGNOSIS — M4726 Other spondylosis with radiculopathy, lumbar region: Secondary | ICD-10-CM | POA: Diagnosis not present

## 2021-07-12 DIAGNOSIS — M9903 Segmental and somatic dysfunction of lumbar region: Secondary | ICD-10-CM | POA: Diagnosis not present

## 2021-07-13 DIAGNOSIS — M9903 Segmental and somatic dysfunction of lumbar region: Secondary | ICD-10-CM | POA: Diagnosis not present

## 2021-07-13 DIAGNOSIS — M5440 Lumbago with sciatica, unspecified side: Secondary | ICD-10-CM | POA: Diagnosis not present

## 2021-07-13 DIAGNOSIS — M5451 Vertebrogenic low back pain: Secondary | ICD-10-CM | POA: Diagnosis not present

## 2021-07-13 DIAGNOSIS — M4726 Other spondylosis with radiculopathy, lumbar region: Secondary | ICD-10-CM | POA: Diagnosis not present

## 2021-07-15 DIAGNOSIS — M4726 Other spondylosis with radiculopathy, lumbar region: Secondary | ICD-10-CM | POA: Diagnosis not present

## 2021-07-15 DIAGNOSIS — M9903 Segmental and somatic dysfunction of lumbar region: Secondary | ICD-10-CM | POA: Diagnosis not present

## 2021-07-15 DIAGNOSIS — M5451 Vertebrogenic low back pain: Secondary | ICD-10-CM | POA: Diagnosis not present

## 2021-07-15 DIAGNOSIS — M5440 Lumbago with sciatica, unspecified side: Secondary | ICD-10-CM | POA: Diagnosis not present

## 2021-07-18 DIAGNOSIS — M9903 Segmental and somatic dysfunction of lumbar region: Secondary | ICD-10-CM | POA: Diagnosis not present

## 2021-07-18 DIAGNOSIS — M5451 Vertebrogenic low back pain: Secondary | ICD-10-CM | POA: Diagnosis not present

## 2021-07-18 DIAGNOSIS — M4726 Other spondylosis with radiculopathy, lumbar region: Secondary | ICD-10-CM | POA: Diagnosis not present

## 2021-07-18 DIAGNOSIS — M5440 Lumbago with sciatica, unspecified side: Secondary | ICD-10-CM | POA: Diagnosis not present

## 2021-07-20 DIAGNOSIS — M9903 Segmental and somatic dysfunction of lumbar region: Secondary | ICD-10-CM | POA: Diagnosis not present

## 2021-07-20 DIAGNOSIS — M5440 Lumbago with sciatica, unspecified side: Secondary | ICD-10-CM | POA: Diagnosis not present

## 2021-07-20 DIAGNOSIS — M5451 Vertebrogenic low back pain: Secondary | ICD-10-CM | POA: Diagnosis not present

## 2021-07-20 DIAGNOSIS — M4726 Other spondylosis with radiculopathy, lumbar region: Secondary | ICD-10-CM | POA: Diagnosis not present

## 2021-07-22 DIAGNOSIS — M4726 Other spondylosis with radiculopathy, lumbar region: Secondary | ICD-10-CM | POA: Diagnosis not present

## 2021-07-22 DIAGNOSIS — M5440 Lumbago with sciatica, unspecified side: Secondary | ICD-10-CM | POA: Diagnosis not present

## 2021-07-22 DIAGNOSIS — M9903 Segmental and somatic dysfunction of lumbar region: Secondary | ICD-10-CM | POA: Diagnosis not present

## 2021-07-22 DIAGNOSIS — M5451 Vertebrogenic low back pain: Secondary | ICD-10-CM | POA: Diagnosis not present

## 2021-07-25 DIAGNOSIS — M5451 Vertebrogenic low back pain: Secondary | ICD-10-CM | POA: Diagnosis not present

## 2021-07-25 DIAGNOSIS — M5440 Lumbago with sciatica, unspecified side: Secondary | ICD-10-CM | POA: Diagnosis not present

## 2021-07-25 DIAGNOSIS — M9903 Segmental and somatic dysfunction of lumbar region: Secondary | ICD-10-CM | POA: Diagnosis not present

## 2021-07-25 DIAGNOSIS — M4726 Other spondylosis with radiculopathy, lumbar region: Secondary | ICD-10-CM | POA: Diagnosis not present

## 2021-07-27 DIAGNOSIS — M5451 Vertebrogenic low back pain: Secondary | ICD-10-CM | POA: Diagnosis not present

## 2021-07-27 DIAGNOSIS — M4726 Other spondylosis with radiculopathy, lumbar region: Secondary | ICD-10-CM | POA: Diagnosis not present

## 2021-07-27 DIAGNOSIS — M5440 Lumbago with sciatica, unspecified side: Secondary | ICD-10-CM | POA: Diagnosis not present

## 2021-07-27 DIAGNOSIS — M9903 Segmental and somatic dysfunction of lumbar region: Secondary | ICD-10-CM | POA: Diagnosis not present

## 2021-07-29 DIAGNOSIS — M5451 Vertebrogenic low back pain: Secondary | ICD-10-CM | POA: Diagnosis not present

## 2021-07-29 DIAGNOSIS — M4726 Other spondylosis with radiculopathy, lumbar region: Secondary | ICD-10-CM | POA: Diagnosis not present

## 2021-07-29 DIAGNOSIS — M9903 Segmental and somatic dysfunction of lumbar region: Secondary | ICD-10-CM | POA: Diagnosis not present

## 2021-07-29 DIAGNOSIS — M5440 Lumbago with sciatica, unspecified side: Secondary | ICD-10-CM | POA: Diagnosis not present

## 2021-08-01 DIAGNOSIS — M9903 Segmental and somatic dysfunction of lumbar region: Secondary | ICD-10-CM | POA: Diagnosis not present

## 2021-08-01 DIAGNOSIS — M542 Cervicalgia: Secondary | ICD-10-CM | POA: Diagnosis not present

## 2021-08-01 DIAGNOSIS — M9901 Segmental and somatic dysfunction of cervical region: Secondary | ICD-10-CM | POA: Diagnosis not present

## 2021-08-01 DIAGNOSIS — M5440 Lumbago with sciatica, unspecified side: Secondary | ICD-10-CM | POA: Diagnosis not present

## 2021-08-02 ENCOUNTER — Telehealth: Payer: BC Managed Care – PPO | Admitting: Adult Health

## 2021-08-03 DIAGNOSIS — M9903 Segmental and somatic dysfunction of lumbar region: Secondary | ICD-10-CM | POA: Diagnosis not present

## 2021-08-03 DIAGNOSIS — M542 Cervicalgia: Secondary | ICD-10-CM | POA: Diagnosis not present

## 2021-08-03 DIAGNOSIS — M5440 Lumbago with sciatica, unspecified side: Secondary | ICD-10-CM | POA: Diagnosis not present

## 2021-08-03 DIAGNOSIS — M9901 Segmental and somatic dysfunction of cervical region: Secondary | ICD-10-CM | POA: Diagnosis not present

## 2021-08-05 DIAGNOSIS — M9901 Segmental and somatic dysfunction of cervical region: Secondary | ICD-10-CM | POA: Diagnosis not present

## 2021-08-05 DIAGNOSIS — M542 Cervicalgia: Secondary | ICD-10-CM | POA: Diagnosis not present

## 2021-08-05 DIAGNOSIS — M5440 Lumbago with sciatica, unspecified side: Secondary | ICD-10-CM | POA: Diagnosis not present

## 2021-08-05 DIAGNOSIS — M9903 Segmental and somatic dysfunction of lumbar region: Secondary | ICD-10-CM | POA: Diagnosis not present

## 2021-08-09 ENCOUNTER — Telehealth: Payer: BC Managed Care – PPO | Admitting: Adult Health

## 2021-08-09 DIAGNOSIS — M9901 Segmental and somatic dysfunction of cervical region: Secondary | ICD-10-CM | POA: Diagnosis not present

## 2021-08-09 DIAGNOSIS — M542 Cervicalgia: Secondary | ICD-10-CM | POA: Diagnosis not present

## 2021-08-09 DIAGNOSIS — M9903 Segmental and somatic dysfunction of lumbar region: Secondary | ICD-10-CM | POA: Diagnosis not present

## 2021-08-09 DIAGNOSIS — M5440 Lumbago with sciatica, unspecified side: Secondary | ICD-10-CM | POA: Diagnosis not present

## 2021-08-11 DIAGNOSIS — M9903 Segmental and somatic dysfunction of lumbar region: Secondary | ICD-10-CM | POA: Diagnosis not present

## 2021-08-11 DIAGNOSIS — M542 Cervicalgia: Secondary | ICD-10-CM | POA: Diagnosis not present

## 2021-08-11 DIAGNOSIS — M5440 Lumbago with sciatica, unspecified side: Secondary | ICD-10-CM | POA: Diagnosis not present

## 2021-08-11 DIAGNOSIS — M9901 Segmental and somatic dysfunction of cervical region: Secondary | ICD-10-CM | POA: Diagnosis not present

## 2021-08-16 DIAGNOSIS — M5440 Lumbago with sciatica, unspecified side: Secondary | ICD-10-CM | POA: Diagnosis not present

## 2021-08-16 DIAGNOSIS — M542 Cervicalgia: Secondary | ICD-10-CM | POA: Diagnosis not present

## 2021-08-16 DIAGNOSIS — M9901 Segmental and somatic dysfunction of cervical region: Secondary | ICD-10-CM | POA: Diagnosis not present

## 2021-08-16 DIAGNOSIS — M9903 Segmental and somatic dysfunction of lumbar region: Secondary | ICD-10-CM | POA: Diagnosis not present

## 2021-08-18 DIAGNOSIS — M542 Cervicalgia: Secondary | ICD-10-CM | POA: Diagnosis not present

## 2021-08-18 DIAGNOSIS — M9903 Segmental and somatic dysfunction of lumbar region: Secondary | ICD-10-CM | POA: Diagnosis not present

## 2021-08-18 DIAGNOSIS — M5440 Lumbago with sciatica, unspecified side: Secondary | ICD-10-CM | POA: Diagnosis not present

## 2021-08-18 DIAGNOSIS — M9901 Segmental and somatic dysfunction of cervical region: Secondary | ICD-10-CM | POA: Diagnosis not present

## 2021-08-23 DIAGNOSIS — M542 Cervicalgia: Secondary | ICD-10-CM | POA: Diagnosis not present

## 2021-08-23 DIAGNOSIS — M5440 Lumbago with sciatica, unspecified side: Secondary | ICD-10-CM | POA: Diagnosis not present

## 2021-08-23 DIAGNOSIS — M9901 Segmental and somatic dysfunction of cervical region: Secondary | ICD-10-CM | POA: Diagnosis not present

## 2021-08-23 DIAGNOSIS — M9903 Segmental and somatic dysfunction of lumbar region: Secondary | ICD-10-CM | POA: Diagnosis not present

## 2021-08-30 ENCOUNTER — Ambulatory Visit (HOSPITAL_COMMUNITY)
Admission: EM | Admit: 2021-08-30 | Discharge: 2021-08-30 | Disposition: A | Discharge status: Home / Self Care | Payer: BC Managed Care – PPO | Attending: Physician Assistant | Admitting: Physician Assistant

## 2021-08-30 ENCOUNTER — Encounter (HOSPITAL_COMMUNITY): Payer: Self-pay | Admitting: Physician Assistant

## 2021-08-30 ENCOUNTER — Other Ambulatory Visit: Payer: Self-pay

## 2021-08-30 DIAGNOSIS — R52 Pain, unspecified: Secondary | ICD-10-CM

## 2021-08-30 DIAGNOSIS — J101 Influenza due to other identified influenza virus with other respiratory manifestations: Secondary | ICD-10-CM | POA: Diagnosis not present

## 2021-08-30 DIAGNOSIS — R051 Acute cough: Secondary | ICD-10-CM | POA: Diagnosis not present

## 2021-08-30 LAB — POC INFLUENZA A AND B ANTIGEN (URGENT CARE ONLY)
INFLUENZA A ANTIGEN, POC: POSITIVE — AB
INFLUENZA B ANTIGEN, POC: NEGATIVE

## 2021-08-30 MED ORDER — OSELTAMIVIR PHOSPHATE 75 MG PO CAPS: 75.0000 mg | ORAL_CAPSULE | Freq: Two times a day (BID) | ORAL | 0 refills | Status: AC

## 2021-08-30 MED ORDER — PROMETHAZINE-DM 6.25-15 MG/5ML PO SYRP: 5.0000 mL | ORAL_SOLUTION | Freq: Three times a day (TID) | ORAL | 0 refills | Status: AC | PRN

## 2021-08-30 NOTE — ED Provider Notes (Signed)
[facility]    CSN: [id number] Arrival date & time: 08/30/21  1638      History   Chief Complaint Chief Complaint  Patient presents with   Cough   Fever    HPI [name] is a 45 y.o. female.   Patient presents today with a 36-hour history of URI symptoms.  Reports body aches, fever, cough, nasal congestion, fatigue, malaise.  Denies any chest pain, shortness of breath, vomiting, diarrhea.  She has tried over-the-counter medications including Tylenol ibuprofen without improvement of symptoms.  Reports household sick contacts with similar symptoms but they were not tested for flu.  Reports that household sick contacts were tested for COVID and were negative.  Patient taken at home COVID test was also negative.  She has not had influenza or COVID-19 vaccine.  She has had COVID in the past most recently 1 year ago.  She does have a history of seasonal allergies managed with [facility] as needed.  She denies history of asthma, COPD, smoking.  She is having difficulty with the activities as result of symptoms.  She is confident she is not pregnant.   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      Home Medications    Prior to Admission medications   Medication Sig Start Date End Date Taking? Authorizing Provider  oseltamivir (TAMIFLU) 75 MG capsule Take 1 capsule (75 mg total) by mouth every 12 (twelve) hours. 08/30/21  Yes Nayra Coury K, PA-C   promethazine-dextromethorphan (PROMETHAZINE-DM) 6.25-15 MG/5ML syrup Take 5 mLs by mouth 3 (three) times daily as needed for cough. 08/30/21  Yes Deneane Stifter K, PA-C  Biotin w/ Vitamins C & E (HAIR SKIN & NAILS GUMMIES PO) Take by mouth. Patient not taking: No sig reported    [provider]  Fexofenadine-Pseudoephedrine (ALLEGRA-D 24 HOUR PO) Take by mouth. Patient not taking: No sig reported    [provider]  levothyroxine (SYNTHROID) 25 MCG tablet Take 25 mcg by mouth daily before breakfast.    [provider]  Multiple Vitamin (MULTI-VITAMIN PO) Take by mouth. Patient not taking: No sig reported    [provider]  UNABLE TO FIND Med Name: Tirzepatide clinical study    [provider]    Family History Family History  Problem Relation Age of Onset   Colon cancer Maternal Aunt    Colon polyps Neg Hx    Esophageal cancer Neg Hx    Stomach cancer Neg Hx    Rectal cancer Neg Hx     Social History Social History   Tobacco Use   Smoking status: Never   Smokeless tobacco: Never  Vaping Use   Vaping Use: Never used  Substance Use Topics   Alcohol use: Yes    Comment: 0-2 week   Drug use: Never     Allergies  Patient has no known allergies.   Review of Systems Review of Systems  Constitutional:  Positive for activity change, appetite change, fatigue and fever.  HENT:  Positive for congestion and sore throat. Negative for sinus pressure and sneezing.   Respiratory:  Positive for cough. Negative for shortness of breath.   Cardiovascular:  Negative for chest pain.  Gastrointestinal:  Positive for nausea. Negative for abdominal pain, diarrhea and vomiting.  Musculoskeletal:  Positive for arthralgias and myalgias.  Neurological:  Positive for headaches. Negative for dizziness and light-headedness.    Physical Exam Triage Vital Signs ED Triage Vitals  Enc Vitals Group     BP 08/30/21 1759 121/81     Pulse Rate 08/30/21  1759 (!) 115     Resp 08/30/21 1759 16     Temp 08/30/21 1759 98.1 F (36.7 C)     Temp Source 08/30/21 1759 Oral     SpO2 08/30/21 1759 100 %     Weight --      Height --      Head Circumference --      Peak Flow --      Pain Score 08/30/21 1800 4     Pain Loc --      Pain Edu? --      Excl. in [place]? --    No data found.  Updated Vital Signs BP 121/81 (BP Location: Right Arm)   Pulse (!) 115   Temp 98.1 F (36.7 C) (Oral)   Resp 16   LMP 08/25/2021   SpO2 100%   Visual Acuity Right Eye Distance:   Left Eye Distance:   Bilateral Distance:    Right Eye Near:   Left Eye Near:    Bilateral Near:     Physical Exam Vitals reviewed.  Constitutional:      General: She is awake. She is not in acute distress.    Appearance: Normal appearance. She is well-developed. She is not ill-appearing.     Comments: Very pleasant female appears stated age in no acute distress laying on exam table  HENT:     Head: Normocephalic and atraumatic.     Right Ear: Tympanic membrane, ear canal and external ear normal. Tympanic membrane is not erythematous or bulging.     Left Ear: Tympanic membrane, ear canal and external ear normal. Tympanic membrane is not erythematous or bulging.     Nose:     Right Sinus: No maxillary sinus tenderness or frontal sinus tenderness.     Left Sinus: No maxillary sinus tenderness or frontal sinus tenderness.     Mouth/Throat:     Pharynx: Uvula midline. No oropharyngeal exudate or posterior oropharyngeal erythema.  Cardiovascular:     Rate and Rhythm: Normal rate and regular rhythm.     Heart sounds: Normal heart sounds, S1 normal and S2 normal. No murmur heard. Pulmonary:     Effort: Pulmonary effort is normal.     Breath sounds: Normal breath sounds. No wheezing, rhonchi or rales.     Comments: Clear to auscultation bilaterally Psychiatric:        Behavior: Behavior is cooperative.     UC Treatments / Results  Labs (all labs ordered are listed, but  only abnormal results are displayed) Labs Reviewed  POC INFLUENZA A AND B ANTIGEN (URGENT CARE ONLY) - Abnormal; Notable for the following components:      Result Value   INFLUENZA A ANTIGEN, POC POSITIVE (*)    All other components within normal  limits    EKG   Radiology No results found.  Procedures Procedures (including critical care time)  Medications Ordered in UC Medications - No data to display  Initial Impression / Assessment and Plan / UC Course  I have reviewed the triage vital signs and the nursing notes.  Pertinent labs & imaging results that were available during my care of the patient were reviewed by me and considered in my medical decision making (see chart for details).     Patient has a positive for influenza A.  She is within 72 hours of symptom onset so we will start Tamiflu twice daily.  She was prescribed meclizine DM for cough with instruction not to drive drink alcohol with this medication as drowsiness is a common side effect.  Recommended she alternate Tylenol ibuprofen for fever and pain and use Mucinex and Flonase for cough and congestion.  She is to rest and drink plenty of fluid.  Discussed alarm symptoms that warrant emergent evaluation.  She was provided work excuse note.  Strict return precautions given to which she expressed understanding.  Final Clinical Impressions(s) / UC Diagnoses   Final diagnoses:  Influenza A  Acute cough  Body aches     Discharge Instructions      You tested positive for influenza A.  Start Tamiflu twice daily.  This can upset her stomach so take it with food.  I have called in Promethazine DM to be used up to 3 times a day for cough.  This will make you sleepy so not drive drink alcohol taking it.  Alternate Tylenol ibuprofen for fever and pain.  Use Mucinex and Flonase for symptom relief.  Make sure you are drinking plenty of fluid.  You can return once you have been fever free and symptoms have improved for 24  hours without medication use.  If you have any worsening symptoms please return for reevaluation.     ED Prescriptions     Medication Sig Dispense Auth. Provider   promethazine-dextromethorphan (PROMETHAZINE-DM) 6.25-15 MG/5ML syrup Take 5 mLs by mouth 3 (three) times daily as needed for cough. 118 mL Brannen Koppen K, PA-C   oseltamivir (TAMIFLU) 75 MG capsule Take 1 capsule (75 mg total) by mouth every 12 (twelve) hours. 10 capsule Alezander Dimaano, Derry Skill, PA-C      PDMP not reviewed this encounter.   Terrilee Croak, PA-C 08/30/21 1848

## 2021-08-30 NOTE — Discharge Instructions (Signed)
You tested positive for influenza A.  Start Tamiflu twice daily.  This can upset her stomach so take it with food.  I have called in Promethazine DM to be used up to 3 times a day for cough.  This will make you sleepy so not drive drink alcohol taking it.  Alternate Tylenol ibuprofen for fever and pain.  Use Mucinex and Flonase for symptom relief.  Make sure you are drinking plenty of fluid.  You can return once you have been fever free and symptoms have improved for 24 hours without medication use.  If you have any worsening symptoms please return for reevaluation.

## 2021-08-30 NOTE — ED Triage Notes (Signed)
Patient presents to the office today for sore throat,coughing and fever x 1 day.

## 2021-12-01 DIAGNOSIS — E785 Hyperlipidemia, unspecified: Secondary | ICD-10-CM | POA: Diagnosis not present

## 2022-04-14 DIAGNOSIS — F419 Anxiety disorder, unspecified: Secondary | ICD-10-CM | POA: Diagnosis not present

## 2022-04-21 DIAGNOSIS — Z1331 Encounter for screening for depression: Secondary | ICD-10-CM | POA: Diagnosis not present

## 2022-04-21 DIAGNOSIS — E785 Hyperlipidemia, unspecified: Secondary | ICD-10-CM | POA: Diagnosis not present

## 2022-04-21 DIAGNOSIS — Z0000 Encounter for general adult medical examination without abnormal findings: Secondary | ICD-10-CM | POA: Diagnosis not present

## 2022-04-21 DIAGNOSIS — Z1339 Encounter for screening examination for other mental health and behavioral disorders: Secondary | ICD-10-CM | POA: Diagnosis not present

## 2022-04-21 DIAGNOSIS — D696 Thrombocytopenia, unspecified: Secondary | ICD-10-CM | POA: Diagnosis not present

## 2022-05-03 DIAGNOSIS — E785 Hyperlipidemia, unspecified: Secondary | ICD-10-CM | POA: Diagnosis not present

## 2022-05-22 DIAGNOSIS — N3946 Mixed incontinence: Secondary | ICD-10-CM | POA: Diagnosis not present

## 2022-05-22 DIAGNOSIS — Z1231 Encounter for screening mammogram for malignant neoplasm of breast: Secondary | ICD-10-CM | POA: Diagnosis not present

## 2022-05-22 DIAGNOSIS — Z124 Encounter for screening for malignant neoplasm of cervix: Secondary | ICD-10-CM | POA: Diagnosis not present

## 2022-05-22 DIAGNOSIS — Z01419 Encounter for gynecological examination (general) (routine) without abnormal findings: Secondary | ICD-10-CM | POA: Diagnosis not present

## 2022-05-22 DIAGNOSIS — Z6841 Body Mass Index (BMI) 40.0 and over, adult: Secondary | ICD-10-CM | POA: Diagnosis not present

## 2022-05-24 ENCOUNTER — Other Ambulatory Visit: Payer: Self-pay | Admitting: Obstetrics and Gynecology

## 2022-05-24 DIAGNOSIS — R928 Other abnormal and inconclusive findings on diagnostic imaging of breast: Secondary | ICD-10-CM

## 2022-06-01 ENCOUNTER — Ambulatory Visit
Admission: RE | Admit: 2022-06-01 | Discharge: 2022-06-01 | Disposition: A | Discharge status: Home / Self Care | Payer: BC Managed Care – PPO | Source: Ambulatory Visit | Attending: Obstetrics and Gynecology | Admitting: Obstetrics and Gynecology

## 2022-06-01 DIAGNOSIS — N6001 Solitary cyst of right breast: Secondary | ICD-10-CM | POA: Diagnosis not present

## 2022-06-01 DIAGNOSIS — R928 Other abnormal and inconclusive findings on diagnostic imaging of breast: Secondary | ICD-10-CM

## 2022-08-09 DIAGNOSIS — E785 Hyperlipidemia, unspecified: Secondary | ICD-10-CM | POA: Diagnosis not present

## 2022-11-15 DIAGNOSIS — E785 Hyperlipidemia, unspecified: Secondary | ICD-10-CM | POA: Diagnosis not present

## 2022-11-15 DIAGNOSIS — E669 Obesity, unspecified: Secondary | ICD-10-CM | POA: Diagnosis not present

## 2023-07-02 DIAGNOSIS — Z0000 Encounter for general adult medical examination without abnormal findings: Secondary | ICD-10-CM | POA: Diagnosis not present

## 2023-07-02 DIAGNOSIS — Z0189 Encounter for other specified special examinations: Secondary | ICD-10-CM | POA: Diagnosis not present

## 2023-07-09 DIAGNOSIS — G4733 Obstructive sleep apnea (adult) (pediatric): Secondary | ICD-10-CM | POA: Diagnosis not present

## 2023-07-09 DIAGNOSIS — R82998 Other abnormal findings in urine: Secondary | ICD-10-CM | POA: Diagnosis not present

## 2023-07-09 DIAGNOSIS — Z1389 Encounter for screening for other disorder: Secondary | ICD-10-CM | POA: Diagnosis not present

## 2023-07-09 DIAGNOSIS — Z0189 Encounter for other specified special examinations: Secondary | ICD-10-CM | POA: Diagnosis not present

## 2023-07-09 DIAGNOSIS — Z0000 Encounter for general adult medical examination without abnormal findings: Secondary | ICD-10-CM | POA: Diagnosis not present

## 2023-07-20 DIAGNOSIS — R21 Rash and other nonspecific skin eruption: Secondary | ICD-10-CM | POA: Diagnosis not present

## 2023-07-20 DIAGNOSIS — Z01411 Encounter for gynecological examination (general) (routine) with abnormal findings: Secondary | ICD-10-CM | POA: Diagnosis not present

## 2023-07-20 DIAGNOSIS — Z1231 Encounter for screening mammogram for malignant neoplasm of breast: Secondary | ICD-10-CM | POA: Diagnosis not present

## 2023-12-12 DIAGNOSIS — G4733 Obstructive sleep apnea (adult) (pediatric): Secondary | ICD-10-CM | POA: Diagnosis not present

## 2024-01-12 DIAGNOSIS — G4733 Obstructive sleep apnea (adult) (pediatric): Secondary | ICD-10-CM | POA: Diagnosis not present

## 2024-02-11 DIAGNOSIS — G4733 Obstructive sleep apnea (adult) (pediatric): Secondary | ICD-10-CM | POA: Diagnosis not present

## 2024-06-15 DIAGNOSIS — U071 COVID-19: Secondary | ICD-10-CM | POA: Diagnosis not present

## 2024-06-20 DIAGNOSIS — U071 COVID-19: Secondary | ICD-10-CM | POA: Diagnosis not present

## 2024-06-20 DIAGNOSIS — R110 Nausea: Secondary | ICD-10-CM | POA: Diagnosis not present

## 2024-07-14 DIAGNOSIS — E785 Hyperlipidemia, unspecified: Secondary | ICD-10-CM | POA: Diagnosis not present

## 2024-07-21 DIAGNOSIS — Z0000 Encounter for general adult medical examination without abnormal findings: Secondary | ICD-10-CM | POA: Diagnosis not present

## 2024-07-21 DIAGNOSIS — E039 Hypothyroidism, unspecified: Secondary | ICD-10-CM | POA: Diagnosis not present

## 2024-07-21 DIAGNOSIS — Z1331 Encounter for screening for depression: Secondary | ICD-10-CM | POA: Diagnosis not present
